# Patient Record
Sex: Female | Born: 1950
Health system: Southern US, Community
[De-identification: ages and names within clinical notes are randomized; demographics above are authoritative.]

## PROBLEM LIST (undated history)

## (undated) DIAGNOSIS — M542 Cervicalgia: Secondary | ICD-10-CM

## (undated) DIAGNOSIS — R51 Headache: Secondary | ICD-10-CM

## (undated) DIAGNOSIS — K635 Polyp of colon: Secondary | ICD-10-CM

## (undated) DIAGNOSIS — K5790 Diverticulosis of intestine, part unspecified, without perforation or abscess without bleeding: Secondary | ICD-10-CM

## (undated) DIAGNOSIS — S161XXA Strain of muscle, fascia and tendon at neck level, initial encounter: Secondary | ICD-10-CM

## (undated) DIAGNOSIS — R011 Cardiac murmur, unspecified: Secondary | ICD-10-CM

## (undated) DIAGNOSIS — Z9989 Dependence on other enabling machines and devices: Principal | ICD-10-CM

## (undated) DIAGNOSIS — T7840XA Allergy, unspecified, initial encounter: Secondary | ICD-10-CM

## (undated) DIAGNOSIS — R519 Headache, unspecified: Secondary | ICD-10-CM

## (undated) DIAGNOSIS — K219 Gastro-esophageal reflux disease without esophagitis: Secondary | ICD-10-CM

## (undated) DIAGNOSIS — I10 Essential (primary) hypertension: Secondary | ICD-10-CM

## (undated) DIAGNOSIS — K649 Unspecified hemorrhoids: Secondary | ICD-10-CM

## (undated) DIAGNOSIS — K222 Esophageal obstruction: Secondary | ICD-10-CM

## (undated) DIAGNOSIS — F32A Depression, unspecified: Secondary | ICD-10-CM

## (undated) DIAGNOSIS — K449 Diaphragmatic hernia without obstruction or gangrene: Secondary | ICD-10-CM

## (undated) DIAGNOSIS — K5792 Diverticulitis of intestine, part unspecified, without perforation or abscess without bleeding: Secondary | ICD-10-CM

## (undated) DIAGNOSIS — F329 Major depressive disorder, single episode, unspecified: Secondary | ICD-10-CM

## (undated) DIAGNOSIS — G4733 Obstructive sleep apnea (adult) (pediatric): Secondary | ICD-10-CM

## (undated) DIAGNOSIS — G4730 Sleep apnea, unspecified: Secondary | ICD-10-CM

## (undated) HISTORY — PX: TMJ ARTHROSCOPY: SHX1067

## (undated) HISTORY — DX: Strain of muscle, fascia and tendon at neck level, initial encounter: S16.1XXA

## (undated) HISTORY — DX: Diaphragmatic hernia without obstruction or gangrene: K44.9

## (undated) HISTORY — DX: Diverticulitis of intestine, part unspecified, without perforation or abscess without bleeding: K57.92

## (undated) HISTORY — DX: Headache: R51

## (undated) HISTORY — DX: Dependence on other enabling machines and devices: Z99.89

## (undated) HISTORY — DX: Major depressive disorder, single episode, unspecified: F32.9

## (undated) HISTORY — PX: OTHER SURGICAL HISTORY: SHX169

## (undated) HISTORY — PX: TONSILLECTOMY: SUR1361

## (undated) HISTORY — DX: Cardiac murmur, unspecified: R01.1

## (undated) HISTORY — PX: COLONOSCOPY: SHX174

## (undated) HISTORY — DX: Sleep apnea, unspecified: G47.30

## (undated) HISTORY — PX: KNEE ARTHROSCOPY W/ MENISCAL REPAIR: SHX1877

## (undated) HISTORY — PX: ABDOMINAL HYSTERECTOMY: SHX81

## (undated) HISTORY — DX: Gastro-esophageal reflux disease without esophagitis: K21.9

## (undated) HISTORY — PX: KNEE SURGERY: SHX244

## (undated) HISTORY — PX: ANKLE SURGERY: SHX546

## (undated) HISTORY — DX: Cervicalgia: M54.2

## (undated) HISTORY — DX: Unspecified hemorrhoids: K64.9

## (undated) HISTORY — DX: Essential (primary) hypertension: I10

## (undated) HISTORY — DX: Headache, unspecified: R51.9

## (undated) HISTORY — DX: Depression, unspecified: F32.A

## (undated) HISTORY — PX: BREAST EXCISIONAL BIOPSY: SUR124

## (undated) HISTORY — DX: Esophageal obstruction: K22.2

## (undated) HISTORY — DX: Obstructive sleep apnea (adult) (pediatric): G47.33

## (undated) HISTORY — DX: Polyp of colon: K63.5

## (undated) HISTORY — DX: Allergy, unspecified, initial encounter: T78.40XA

## (undated) HISTORY — DX: Diverticulosis of intestine, part unspecified, without perforation or abscess without bleeding: K57.90

## (undated) HISTORY — PX: POLYPECTOMY: SHX149

---

## 1994-07-01 ENCOUNTER — Encounter: Payer: Self-pay | Admitting: Internal Medicine

## 1994-08-05 ENCOUNTER — Encounter: Payer: Self-pay | Admitting: Internal Medicine

## 1994-09-23 ENCOUNTER — Encounter: Payer: Self-pay | Admitting: Internal Medicine

## 1996-07-18 ENCOUNTER — Encounter: Payer: Self-pay | Admitting: Internal Medicine

## 1997-05-28 ENCOUNTER — Encounter: Payer: Self-pay | Admitting: Internal Medicine

## 1998-07-29 ENCOUNTER — Other Ambulatory Visit: Admission: RE | Admit: 1998-07-29 | Discharge: 1998-07-29 | Payer: Self-pay | Admitting: Obstetrics and Gynecology

## 1999-07-21 ENCOUNTER — Other Ambulatory Visit: Admission: RE | Admit: 1999-07-21 | Discharge: 1999-07-21 | Payer: Self-pay | Admitting: Obstetrics and Gynecology

## 2000-08-16 ENCOUNTER — Other Ambulatory Visit: Admission: RE | Admit: 2000-08-16 | Discharge: 2000-08-16 | Payer: Self-pay | Admitting: Obstetrics and Gynecology

## 2001-05-02 ENCOUNTER — Other Ambulatory Visit: Admission: RE | Admit: 2001-05-02 | Discharge: 2001-05-02 | Payer: Self-pay | Admitting: Dermatology

## 2001-07-25 ENCOUNTER — Ambulatory Visit (HOSPITAL_BASED_OUTPATIENT_CLINIC_OR_DEPARTMENT_OTHER): Admission: RE | Admit: 2001-07-25 | Discharge: 2001-07-25 | Payer: Self-pay | Admitting: Orthopedic Surgery

## 2001-07-28 ENCOUNTER — Encounter (HOSPITAL_COMMUNITY): Admission: RE | Admit: 2001-07-28 | Discharge: 2001-08-27 | Payer: Self-pay | Admitting: Orthopedic Surgery

## 2001-08-22 ENCOUNTER — Other Ambulatory Visit: Admission: RE | Admit: 2001-08-22 | Discharge: 2001-08-22 | Payer: Self-pay | Admitting: Obstetrics and Gynecology

## 2001-08-30 ENCOUNTER — Encounter (HOSPITAL_COMMUNITY): Admission: RE | Admit: 2001-08-30 | Discharge: 2001-09-29 | Payer: Self-pay | Admitting: Orthopedic Surgery

## 2001-09-01 ENCOUNTER — Emergency Department (HOSPITAL_COMMUNITY): Admission: EM | Admit: 2001-09-01 | Discharge: 2001-09-01 | Payer: Self-pay | Admitting: *Deleted

## 2002-09-01 ENCOUNTER — Encounter: Payer: Self-pay | Admitting: Internal Medicine

## 2002-10-03 ENCOUNTER — Encounter: Payer: Self-pay | Admitting: Internal Medicine

## 2003-07-04 ENCOUNTER — Encounter: Admission: RE | Admit: 2003-07-04 | Discharge: 2003-07-04 | Payer: Self-pay | Admitting: Obstetrics and Gynecology

## 2003-08-31 ENCOUNTER — Emergency Department (HOSPITAL_COMMUNITY): Admission: EM | Admit: 2003-08-31 | Discharge: 2003-08-31 | Payer: Self-pay | Admitting: Emergency Medicine

## 2003-11-29 ENCOUNTER — Other Ambulatory Visit: Admission: RE | Admit: 2003-11-29 | Discharge: 2003-11-29 | Payer: Self-pay | Admitting: Dermatology

## 2004-06-03 ENCOUNTER — Encounter: Payer: Self-pay | Admitting: Internal Medicine

## 2004-07-04 ENCOUNTER — Encounter: Admission: RE | Admit: 2004-07-04 | Discharge: 2004-07-04 | Payer: Self-pay | Admitting: Obstetrics and Gynecology

## 2005-07-06 ENCOUNTER — Encounter: Admission: RE | Admit: 2005-07-06 | Discharge: 2005-07-06 | Payer: Self-pay | Admitting: Obstetrics and Gynecology

## 2005-09-14 ENCOUNTER — Ambulatory Visit (HOSPITAL_BASED_OUTPATIENT_CLINIC_OR_DEPARTMENT_OTHER): Admission: RE | Admit: 2005-09-14 | Discharge: 2005-09-14 | Payer: Self-pay | Admitting: Urology

## 2006-07-09 ENCOUNTER — Encounter: Admission: RE | Admit: 2006-07-09 | Discharge: 2006-07-09 | Payer: Self-pay | Admitting: Obstetrics and Gynecology

## 2007-01-21 ENCOUNTER — Ambulatory Visit (HOSPITAL_COMMUNITY): Admission: RE | Admit: 2007-01-21 | Discharge: 2007-01-21 | Payer: Self-pay | Admitting: Obstetrics and Gynecology

## 2007-03-06 ENCOUNTER — Emergency Department (HOSPITAL_COMMUNITY): Admission: EM | Admit: 2007-03-06 | Discharge: 2007-03-06 | Payer: Self-pay | Admitting: Emergency Medicine

## 2007-03-07 ENCOUNTER — Ambulatory Visit (HOSPITAL_COMMUNITY): Admission: RE | Admit: 2007-03-07 | Discharge: 2007-03-07 | Payer: Self-pay | Admitting: Emergency Medicine

## 2007-03-17 ENCOUNTER — Ambulatory Visit: Payer: Self-pay | Admitting: Internal Medicine

## 2007-04-01 ENCOUNTER — Ambulatory Visit: Payer: Self-pay | Admitting: Internal Medicine

## 2007-04-01 ENCOUNTER — Encounter: Payer: Self-pay | Admitting: Internal Medicine

## 2007-04-01 DIAGNOSIS — D126 Benign neoplasm of colon, unspecified: Secondary | ICD-10-CM

## 2007-04-30 DIAGNOSIS — K5730 Diverticulosis of large intestine without perforation or abscess without bleeding: Secondary | ICD-10-CM | POA: Insufficient documentation

## 2007-04-30 DIAGNOSIS — R1031 Right lower quadrant pain: Secondary | ICD-10-CM | POA: Insufficient documentation

## 2007-04-30 DIAGNOSIS — M129 Arthropathy, unspecified: Secondary | ICD-10-CM | POA: Insufficient documentation

## 2007-04-30 DIAGNOSIS — N201 Calculus of ureter: Secondary | ICD-10-CM

## 2007-04-30 DIAGNOSIS — K644 Residual hemorrhoidal skin tags: Secondary | ICD-10-CM | POA: Insufficient documentation

## 2007-04-30 DIAGNOSIS — K648 Other hemorrhoids: Secondary | ICD-10-CM | POA: Insufficient documentation

## 2007-04-30 DIAGNOSIS — R1013 Epigastric pain: Secondary | ICD-10-CM | POA: Insufficient documentation

## 2007-04-30 DIAGNOSIS — K7689 Other specified diseases of liver: Secondary | ICD-10-CM | POA: Insufficient documentation

## 2007-04-30 DIAGNOSIS — I10 Essential (primary) hypertension: Secondary | ICD-10-CM | POA: Insufficient documentation

## 2007-04-30 DIAGNOSIS — K625 Hemorrhage of anus and rectum: Secondary | ICD-10-CM

## 2007-07-11 ENCOUNTER — Encounter: Admission: RE | Admit: 2007-07-11 | Discharge: 2007-07-11 | Payer: Self-pay | Admitting: Obstetrics and Gynecology

## 2007-10-03 ENCOUNTER — Telehealth: Payer: Self-pay | Admitting: Internal Medicine

## 2007-10-07 ENCOUNTER — Ambulatory Visit: Payer: Self-pay | Admitting: Internal Medicine

## 2007-10-07 DIAGNOSIS — K589 Irritable bowel syndrome without diarrhea: Secondary | ICD-10-CM

## 2007-10-07 DIAGNOSIS — K219 Gastro-esophageal reflux disease without esophagitis: Secondary | ICD-10-CM

## 2007-10-07 LAB — CONVERTED CEMR LAB
Hemoglobin, Urine: NEGATIVE
Ketones, ur: NEGATIVE mg/dL
Leukocytes, UA: NEGATIVE
Specific Gravity, Urine: 1.02 (ref 1.000–1.03)
pH: 6 (ref 5.0–8.0)

## 2007-10-12 ENCOUNTER — Ambulatory Visit (HOSPITAL_COMMUNITY): Admission: RE | Admit: 2007-10-12 | Discharge: 2007-10-12 | Payer: Self-pay | Admitting: Internal Medicine

## 2007-10-18 ENCOUNTER — Telehealth: Payer: Self-pay | Admitting: Internal Medicine

## 2007-10-20 ENCOUNTER — Ambulatory Visit: Payer: Self-pay | Admitting: Internal Medicine

## 2007-11-23 ENCOUNTER — Ambulatory Visit: Payer: Self-pay | Admitting: Internal Medicine

## 2008-07-13 ENCOUNTER — Encounter: Admission: RE | Admit: 2008-07-13 | Discharge: 2008-07-13 | Payer: Self-pay | Admitting: Obstetrics and Gynecology

## 2009-07-15 ENCOUNTER — Ambulatory Visit (HOSPITAL_COMMUNITY)
Admission: RE | Admit: 2009-07-15 | Discharge: 2009-07-15 | Payer: Self-pay | Source: Home / Self Care | Admitting: Obstetrics and Gynecology

## 2010-04-29 ENCOUNTER — Ambulatory Visit (HOSPITAL_COMMUNITY)
Admission: RE | Admit: 2010-04-29 | Discharge: 2010-04-29 | Payer: Self-pay | Source: Home / Self Care | Attending: Family Medicine | Admitting: Family Medicine

## 2010-04-29 ENCOUNTER — Encounter: Payer: Self-pay | Admitting: Internal Medicine

## 2010-04-29 ENCOUNTER — Encounter (INDEPENDENT_AMBULATORY_CARE_PROVIDER_SITE_OTHER): Payer: Self-pay | Admitting: *Deleted

## 2010-05-04 ENCOUNTER — Encounter: Payer: Self-pay | Admitting: Obstetrics and Gynecology

## 2010-05-15 NOTE — Letter (Signed)
Summary: [facility]   Imported By: Lester Naponee 05/06/2010 11:18:12  _____________________________________________________________________  External Attachment:    Type:   Image     Comment:   External Document

## 2010-06-09 ENCOUNTER — Ambulatory Visit (INDEPENDENT_AMBULATORY_CARE_PROVIDER_SITE_OTHER): Payer: PRIVATE HEALTH INSURANCE | Admitting: Internal Medicine

## 2010-06-09 ENCOUNTER — Encounter: Payer: Self-pay | Admitting: Internal Medicine

## 2010-06-09 DIAGNOSIS — K5732 Diverticulitis of large intestine without perforation or abscess without bleeding: Secondary | ICD-10-CM

## 2010-06-09 DIAGNOSIS — K219 Gastro-esophageal reflux disease without esophagitis: Secondary | ICD-10-CM

## 2010-06-10 NOTE — Procedures (Signed)
Summary: Colonoscopy   Colonoscopy  Procedure date:  10/03/2002  Findings:      Location:  [facility].  Results: Diverticulosis.         Procedures Next Due Date:    Colonoscopy: 10/2007 Patient Name: Shelley Petersen, Shelley Petersen MRN:  Procedure Procedures: Colonoscopy CPT: [phone].  Personnel: Endoscopist: Wilhemina Bonito. Marina Goodell, MD.  Referred By: Malachi Pro Ambrose Mantle, MD.  Exam Location: Exam performed in Outpatient Clinic. Outpatient  Patient Consent: Procedure, Alternatives, Risks and Benefits discussed, consent obtained, from patient. Consent was obtained by the RN.  Indications  Average Risk Screening Routine.  History  Pre-Exam Physical: Performed Oct 03, 2002. Entire physical exam was normal.  Exam Exam: Extent of exam reached: Cecum, extent intended: Cecum.  The cecum was identified by appendiceal orifice and IC valve. Patient position: on left side. Colon retroflexion performed. Images taken. ASA Classification: II. Tolerance: excellent.  Monitoring: Pulse and BP monitoring, Oximetry used. Supplemental O2 given.  Colon Prep Used Golytely for colon prep. Prep results: good.  Sedation Meds: Patient assessed and found to be appropriate for moderate (conscious) sedation. Fentanyl 75 mcg. given IV. Versed 7 mg. given IV.  Findings NORMAL EXAM: Cecum to Rectum.  - DIVERTICULOSIS: Ascending Colon to Sigmoid Colon. ICD9: Diverticulosis, Colon: 562.10.   Assessment Abnormal examination, see findings above.  Diagnoses: 562.10: Diverticulosis, Colon.   Events  Unplanned Interventions: No intervention was required.  Unplanned Events: There were no complications. Plans Disposition: After procedure patient sent to recovery. After recovery patient sent home.  Scheduling/Referral: Colonoscopy, to Wilhemina Bonito. Marina Goodell, MD, in about 5 years for repeat screening,    This report was created from the original endoscopy report, which was reviewed and signed by the above listed  endoscopist.   cc:  Tracey Harries, MD      [facility]      The Patient

## 2010-06-19 NOTE — Consult Note (Signed)
[facility]   Imported By: Sherian Rein 06/09/2010 14:25:04  _____________________________________________________________________  External Attachment:    Type:   Image     Comment:   External Document

## 2010-06-19 NOTE — Consult Note (Signed)
[facility]   Imported By: Sherian Rein 06/09/2010 14:26:09  _____________________________________________________________________  External Attachment:    Type:   Image     Comment:   External Document

## 2010-06-19 NOTE — Progress Notes (Signed)
[facility]   Imported By: Sherian Rein 06/09/2010 14:23:24  _____________________________________________________________________  External Attachment:    Type:   Image     Comment:   External Document

## 2010-06-19 NOTE — Assessment & Plan Note (Signed)
Summary: Abdominal pain -  diverticulitis    History of Present Illness Visit Type: Follow-up Visit Primary GI Petersen: Shelley Petersen Primary Provider: Audrea Muscat, Petersen Chief Complaint: Diverticular disease flare Jan. 17, 2012 History of Present Illness:    60 year old female with a history of gastroesophageal reflux disease, incidental esophageal stricture, irritable bowel syndrome, diverticulosis, hypertension, and hemorrhoids. She was last seen August 2009. She was continued on PPI therapy for GERD. She presents today after having had a recent bout of diverticulitis. In mid January , she presented to her PCP with left lower quadrant pain and low-grade fever. She was treated with a 10 day course of ciprofloxacin and metronidazole. Pain resolved within one week. No problems since. She is accompanied today by her husband. She occasionally notices some bright red blood on the tissue with wiping only. Her last complete colonoscopy was in 2008. Severe diverticulosis, hemorrhoids, and hyperplastic polyp removed. Followup in 10 years recommended. Also previous colonoscopy in 2004. Terms of GERD, she reports good control of symptoms with Prilosec. No dysphasia. Otherwise, been doing well.   GI Review of Systems    Reports acid reflux.      Denies abdominal pain, belching, bloating, chest pain, dysphagia with liquids, dysphagia with solids, heartburn, loss of appetite, nausea, vomiting, vomiting blood, weight loss, and  weight gain.      Reports diverticulosis.     Denies anal fissure, black tarry stools, change in bowel habit, constipation, diarrhea, fecal incontinence, heme positive stool, hemorrhoids, irritable bowel syndrome, jaundice, light color stool, liver problems, rectal bleeding, and  rectal pain.    Current Medications (verified): 1)  Aspirin Ec 81 Mg  Tbec (Aspirin) .Marland Kitchen.. 1 Tablet Once Daily 2)  Prilosec 20 Mg  Cpdr (Omeprazole) .Marland Kitchen.. 1 Each Day 30 Minutes Before Meal 3)  Aleve 220 Mg Tabs  (Naproxen Sodium) .... Take 2 Tablets Daily  Allergies: 1)  ! * Tylenol #3 2)  ! Percocet 3)  ! * Tramadol  Past History:  Past Medical History: Reviewed history from 04/30/2007 and no changes required. Current Problems:  COLONIC POLYPS, HYPERPLASTIC (ICD-211.3) ARTHRITIS (ICD-716.90) HYPERTENSION (ICD-401.9) URETEROLITHIASIS (ICD-592.1) HEPATIC CYST (ICD-573.8) DIVERTICULAR DISEASE (ICD-562.10) EPIGASTRIC DISCOMFORT (ICD-789.06) HEMORRHOIDS, EXTERNAL (ICD-455.3) INTERNAL HEMORRHOIDS (ICD-455.0) ABDOMINAL PAIN, RIGHT LOWER QUADRANT (ICD-789.03) RECTAL BLEEDING (ICD-569.3)  Past Surgical History: Reviewed history from 04/30/2007 and no changes required. Breast surgery 1980s Total Abdominal Hysterectomy  Family History: Reviewed history from 11/23/2007 and no changes required. Diabetes and heart disease in her father. Breast cancer in her mother. No gastrointestinal disorders or gastrointestinal malignancy. Family History of Colon Cancer:Father  Social History: Reviewed history from 11/23/2007 and no changes required. Married. Employed as a Advertising account planner. Does not smoke or use alcohol. Daily Caffeine Use-1 Patient gets regular exercise.  Review of Systems       The patient complains of arthritis/joint pain.  The patient denies allergy/sinus, anemia, anxiety-new, back pain, blood in urine, breast changes/lumps, change in vision, confusion, cough, coughing up blood, depression-new, fainting, fatigue, fever, headaches-new, hearing problems, heart murmur, heart rhythm changes, itching, menstrual pain, muscle pains/cramps, night sweats, nosebleeds, pregnancy symptoms, shortness of breath, skin rash, sleeping problems, sore throat, swelling of feet/legs, swollen lymph glands, thirst - excessive , urination - excessive , urination changes/pain, urine leakage, vision changes, and voice change.    Vital Signs:  Patient profile:   60 year old female Height:      59.5  inches Weight:      130.25 pounds BMI:  25.96 Pulse rate:   72 / minute Pulse rhythm:   regular BP sitting:   116 / 80  (left arm) Cuff size:   regular  Vitals Entered By: June McMurray CMA Duncan Dull) (June 09, 2010 8:26 AM)  Physical Exam  General:  Well developed, well nourished, no acute distress. Head:  Normocephalic and atraumatic. Eyes:  PERRLA, no icterus. Nose:  No deformity, discharge,  or lesions. Mouth:  No deformity or lesions, dentition normal. Neck:  Supple; no masses or thyromegaly. Lungs:  Clear throughout to auscultation. Heart:  Regular rate and rhythm; no murmurs, rubs,  or bruits. Abdomen:  Soft, nontender and nondistended. No masses, hepatosplenomegaly or hernias noted. Normal bowel sounds. Msk:  Symmetrical with no gross deformities. Normal posture. Pulses:  Normal pulses noted. Extremities:  No clubbing, cyanosis, edema or deformities noted. Neurologic:  Alert and  oriented x4 Skin:  Intact without significant lesions or rashes. Psych:  Alert and cooperative. Normal mood and affect.   Impression & Recommendations:  Problem # 1:  DIVERTICULITIS-COLON (ICD-562.11)  recent bout of acute left lower quadrant abdominal pain and low-grade fever consistent with diverticulitis. good response to appropriate antimicrobial therapy. Currently asymptomatic.   Plan  #1. Discussion on diverticular disease  #2. literature on diverticular disease  #3. followup when necessary  Problem # 2:  COLONIC POLYPS, HYPERPLASTIC (ICD-211.3)  colonoscopy up to date. due for routine surveillance in 2018  Problem # 3:  GERD (ICD-530.81)  good symptom control on Prilosec.  Plan  #1. Reflux precautions #2. Continue Prilosec to control GERD symptoms  #3. routine followup in 2 years. Sooner for interval problems such as breakthrough symptoms on medication for the development of dysphagia  Problem # 4:  IRRITABLE BOWEL SYNDROME (ICD-564.1) Assessment: Comment Only  Problem #  5:  HEMORRHOIDS, EXTERNAL (ICD-455.3) Assessment: Comment Only  Problem # 6:  INTERNAL HEMORRHOIDS (ICD-455.0) Assessment: Comment Only  Patient Instructions: 1)  Please schedule a follow-up appointment as needed.  2)  The medication list was reviewed and reconciled.  All changed / newly prescribed medications were explained.  A complete medication list was provided to the patient / caregiver. 3)  Copy sent to : Audrea Muscat, Petersen

## 2010-06-19 NOTE — Progress Notes (Signed)
[facility]   Imported By: Sherian Rein 06/09/2010 14:17:56  _____________________________________________________________________  External Attachment:    Type:   Image     Comment:   External Document

## 2010-06-19 NOTE — Op Note (Signed)
[facility]   Imported By: Sherian Rein 06/09/2010 14:19:14  _____________________________________________________________________  External Attachment:    Type:   Image     Comment:   External Document

## 2010-06-19 NOTE — Progress Notes (Signed)
[facility]   Imported By: Sherian Rein 06/09/2010 14:21:29  _____________________________________________________________________  External Attachment:    Type:   Image     Comment:   External Document

## 2010-06-19 NOTE — Consult Note (Signed)
[facility]   Imported By: Sherian Rein 06/09/2010 14:16:38  _____________________________________________________________________  External Attachment:    Type:   Image     Comment:   External Document

## 2010-08-26 NOTE — Assessment & Plan Note (Signed)
[facility]                         GASTROENTEROLOGY OFFICE NOTE   NOELE, ICENHOUR                         MRN:          [id number]  DATE:03/17/2007                            DOB:          Jun 16, 1950    REFERRING PHYSICIAN:  Malachi Pro. Ambrose Mantle, M.D.   OFFICE CONSULT:   PROBLEMS:  1. Right lower quadrant pain.  2. Intermittent rectal bleeding.  3. Painful hemorrhoids.  4. Epigastric discomfort.   HISTORY:  Shelley Petersen is a pleasant 60 year old white female known previously  to Dr. Marina Goodell, who had undergone colonoscopy in June, 2004, which was  done for routine screening.  She did have diverticular disease from the  ascending colon to the sigmoid colon.  She has not been seen here since  2004.  At this time, she says she has been having three separate  problems.  She had an ER visit in November due to complaints of acute  epigastric pain.  She had undergone abdominal ultrasound, which was  negative with the exception of multiple small hepatic cysts.  She is  concerned about this epigastric discomfort, which has persistent  intermittently but has not been severe because she has a strong family  history of gallbladder disease in both her mother and her sisters.  Interestingly, the patient had started on Aleve 1 twice daily about a  week before that pain started because she had been having problems with  her knee.  She continues on the Aleve b.i.d. at this point.  She has not  been having any nausea or vomiting.  Says that her pain is usually worse  post prandially but not always.  She has not had any fevers or chills.   The second problem is intermittent rectal bleeding which she has noticed  since June, 2008.  She says generally this is just a small amount of  bright red blood on the tissue only, and she had attributed this to  hemorrhoids.  Now, over the past couple of weeks, she has had a  hemorrhoid which has been protruding and has been quite  uncomfortable.  She has been using Preparation H without any benefit.   The third problem is that of a sharp right lower quadrant pain which has  been bothering her intermittently over the past six months.  She says at  times this is quite intense and feels like labor pains.  She has been  seen and evaluated by Dr. Ambrose Mantle, who ruled out any underlying GYN  pathology.  She is status post hysterectomy in the 1980s.  She did have  CT scan of the abdomen and pelvis done in October, 2008.  We do have  that report, and this was read as a negative exam.   CURRENT MEDICATIONS:  1. Omega 3 once daily.  2. Calcium supplement daily.  3. Aspirin 81 mg daily.  4. Sinus supplement daily.  5. Aleve 1 b.i.d.   INTOLERANCES/ALLERGIES:  TYLENOL #3, with nausea.   PAST MEDICAL HISTORY:  1. Ureterolithiasis.  2. Hypertension.  3. Arthritis.  4. She had a torn meniscus in her left  knee in 2008.  5. Had remote breast surgery in the 1980s and a hysterectomy in the      1980s, as above.   FAMILY HISTORY:  Positive for diabetes and heart disease in her father,  breast cancer in her mother.  Negative for GI disease.   SOCIAL HISTORY:  Patient is married.  She is employed as a Radio broadcast assistant.  She is a nonsmoker, nondrinker.   REVIEW OF SYSTEMS:  Positive for chronic low back pain, chronic  insomnia.  She apparently has a cyst on her bladder and will  intermittently see a small amount of hematuria and also has some  incontinence periodically.   PHYSICAL EXAMINATION:  A well-developed white female in no acute  distress.  Height is 4 foot 11-1/2.  Weight is 134.  Blood pressure 118/78.  Pulse  is 78.  HEENT:  Normocephalic and atraumatic.  EOMI.  PERRLA.  Sclerae are  anicteric.  NECK:  Supple.  CARDIOVASCULAR:  Regular rate and rhythm with an S1 and S2.  No murmur,  rub or gallop.  PULMONARY:  Clear to A&P.  ABDOMEN:  Soft.  Bowel sounds are active.  She is tender in the  epigastrium and right upper  quadrant, also in the right lower quadrant.  There is no guarding or rebound.  No mass or hepatosplenomegaly.  RECTAL:  A large, prolapsed internal hemorrhoid which is inflamed but  not thrombosed.  Quite tender to exam.  Stool is brown and hemoccult  negative currently.   IMPRESSION:  52. A 60 year old white female with persistent epigastric discomfort x3      weeks, recent negative ultrasound.  Rule out nonsteroidal anti-      inflammatory induced gastritis or peptic ulcer disease.  Rule out      biliary dyskinesia.  2. Intermittent hematochezia, etiology not clear.  3. Internal hemorrhoid prolapse.  4. Right lower quadrant pain x6 months with recent negative CT.   PLAN:  1. DC Aleve.  2. Start Protonix 40 mg p.o. b.i.d. x1 week, then daily for six weeks.      If her symptoms persist, she may need endoscopy or CCK HIDA scan.  3. Start Analpram 2.5% cream t.i.d. x10 days, then p.r.n. thereafter.      Also, Anusol-HC suppositories nightly x10 days, then p.r.n.  I      discussed sitz baths and Tucks pads with her for comfort.  4. Schedule colonoscopy for further evaluation of the hematochezia and      persistent right lower quadrant pain.      Mike Gip, PA-C  Electronically Signed      Shelley Boop, MD,FACG  Electronically Signed   AE/MedQ  DD: 03/17/2007  DT: 03/18/2007  Job #: [id number]   cc:   Wilhemina Bonito. Marina Goodell, MD

## 2010-08-29 NOTE — Op Note (Signed)
[place]  Patient:    Shelley Petersen, Shelley Petersen Visit Number: [id number] MRN: [id number]          Service Type: [facility] Location: [facility] Attending Physician:  Twana First Dictated by:   Elana Alm Thurston Hole, M.D. Proc. Date: 07/25/01 Admit Date:  07/28/2001                             Operative Report  PREOPERATIVE DIAGNOSIS:  Right shoulder rotator cuff tendonitis with impingement and acromioclavicular joint arthrosis.  POSTOPERATIVE DIAGNOSES: 1. Right shoulder partial rotator cuff tear with impingement. 2. Right shoulder acromioclavicular joint arthrosis.  PROCEDURE: 1. Right shoulder EUA followed by arthroscopic partial rotator cuff tear    debridement. 2. Right shoulder subacromial decompression. 3. Right shoulder distal clavicle excision.  SURGEON:  Elana Alm. Thurston Hole, M.D.  ASSISTANT:  Julien Girt, P.A.  ANESTHESIA:  General.  OPERATIVE TIME:  45 minutes.  COMPLICATIONS:  None.  INDICATIONS FOR PROCEDURE:  Shelley Petersen is a 60 year old woman who has had over a year of right shoulder pain, increasing in nature, with signs and symptoms and MRI documenting a partial rotator cuff tear and impingement with AC joint arthrosis who has failed conservative care and is now to undergo arthroscopy.  DESCRIPTION:  Shelley Petersen was brought to the operating room on July 25, 2001, placed on the operative table in supine position.  After an adequate level of general anesthesia was obtained, her right shoulder was examined under anesthesia.  She had full range of motion and her shoulder was stable to ligamentous exam.  She was then placed in a beach chair position and her shoulder and arm were prepped using sterile Betadine and draped using sterile technique.  Originally through a posterior arthroscopic portal the arthroscope with a pump attached was placed, and through an anterior portal an arthroscopic probe was placed.  On initial inspection the  articular cartilage in the glenohumeral joint was intact, anterior and posterior labrum intact, superior labrum intact, biceps tendon anchor intact, biceps tendon intact, inferior labrum and anterior inferior glenohumeral ligament complex was intact.  The rotator cuff was thoroughly inspected but there was found to be no evidence of a tear.  Subacromial space was entered and a lateral arthroscopic portal was made.  Moderately thickened bursitis was resected. Underneath this, the rotator cuff had partial tearing on the bursal surface which was debrided but no complete tear was found.  Significant spurring noted in the acromion and in the [facility] joint area.  A subacromial decompression was carried out, removing 6-8 mm of the undersurface of the anterior, anterolateral, and anteromedial acromion; and CA ligament release carried out as well.  The distal clavicle was exposed, and using a 6 mm burr the distal 5-6 mm was resected under arthroscopic visualization for significant arthrosis and impingement in this area.  There was no definite cystic structure noted above this area, but this complete area was completely resected satisfactorily such that no impingement was found through a full range of motion of the shoulder.  After this was done it was felt that all pathology had been satisfactorily addressed and the shoulder could be brought through a full range of motion with no impingement.  The arthroscopic instruments were removed.  Portals were closed with 3-0 nylon suture and injected with 0.25% Marcaine with epinephrine, sterile dressings and a sling applied, the patient awakened and take to the recovery room in stable condition.  FOLLOW-UP  CARE:  Shelley Petersen will be followed as an outpatient on Vicodin and Naprosyn.  See her back in the office in a week for sutures out and follow-up. Dictated by:   Elana Alm Thurston Hole, M.D. Attending Physician:  Twana First DD:  07/25/01 TD:  07/25/01 Job:  [id number]

## 2010-08-29 NOTE — Op Note (Signed)
NAME:  MARCELLA, Shelley Petersen                  ACCOUNT NO.:  [id number]   MEDICAL RECORD NO.:  [id number]          PATIENT TYPE:  AMB   LOCATION:  [facility]                         FACILITY:  [facility]   PHYSICIAN:  Bertram Millard. Dahlstedt, M.D.DATE OF BIRTH:  [birth year]   DATE OF PROCEDURE:  09/14/2005  DATE OF DISCHARGE:                                 OPERATIVE REPORT   PREOPERATIVE DIAGNOSIS:  Possible interstitial cystitis.   POSTOPERATIVE DIAGNOSIS:  Probable interstitial cystitis.   SURGEON:  Bertram Millard. Dahlstedt, M.D.   ANESTHESIA:  General LMA.   SURGICAL PROCEDURES:  1.  Cystoscopy.  2.  Urethral dilation.  3.  Hydrodistention of the bladder.  4.  Instillation of Pyridium and Marcaine.   BRIEF HISTORY:  This 60 year old female comes to the outpatient surgery  today for a cystoscopy, hydrodistention of her bladder.  She has symptoms  reminiscent of interstitial cystitis.  She has frequency, urgency and some  pelvic pain.  With her frequency and urgency recent urodynamics revealed a  normal capacity bladder, but with significant urgency with low volumes of  water infused.  Her maximum capacity was 400 cc, but at that time she had  severe plain pain..  She had a fairly noncompliant and hypersensitive  bladder.   She was seen recently and we talked about treatment options.  She has  decided to go with cystoscopy and HOB.  Risks and complications have been  discussed with the patient, who understands these and desires to proceed.   DESCRIPTION OF PROCEDURE:  Franceen was identified in the holding area,  administered preoperative IV antibiotics, taken to the operating room where  general anesthetic was administered using LMA.  She was placed in the dorsal  lithotomy position.  Genitalia and perineum were prepped and draped.  An  attempt was made to place a cystoscope in the bladder using the obturator.  However, she had a stenotic urethra.  She was dilated to 24-French with  female sounds.  At  this point, the scope passed easily.  The urethra itself  appeared normal.  Bladder was inspected circumferentially.  There were no  foreign bodies, no ulcers, no significant erythematous areas.  The ureteral  orifices were normal configuration and location.  Her bladder was filled  with water at 800 mm above the bladder.  The bladder was hydrodistended for  approximately 5 minutes.  The bladder capacity was 650 cc under anesthetic.  Inspection of the bladder following this revealed multiple glomerulations.  No other lesions were seen.  At this point, the bladder was decompressed.  A  20 cc solution of Marcaine and Pyridium was then placed.   Additionally, a B & O suppository was placed.  The patient was will be  discharged on hydrocodone/APAP,  which she has at home.  She was also given  a 3-day course of Bactrim DS one p.o. b.i.d., and [place] one p.o. q.6 h p.r.n.  bladder frequency and urgency.  She will follow up in 1-2 weeks.      Bertram Millard. Dahlstedt, M.D.  Electronically Signed     SMD/MEDQ  D:  09/14/2005  T:  09/14/2005  Job:  [id number]

## 2010-12-25 ENCOUNTER — Other Ambulatory Visit (HOSPITAL_COMMUNITY): Payer: Self-pay | Admitting: Family Medicine

## 2010-12-25 DIAGNOSIS — R109 Unspecified abdominal pain: Secondary | ICD-10-CM

## 2010-12-30 ENCOUNTER — Ambulatory Visit (HOSPITAL_COMMUNITY)
Admission: RE | Admit: 2010-12-30 | Discharge: 2010-12-30 | Disposition: A | Discharge status: Home / Self Care | Payer: PRIVATE HEALTH INSURANCE | Source: Ambulatory Visit | Attending: Family Medicine | Admitting: Family Medicine

## 2010-12-30 ENCOUNTER — Other Ambulatory Visit (HOSPITAL_COMMUNITY): Payer: Self-pay | Admitting: Family Medicine

## 2010-12-30 DIAGNOSIS — R109 Unspecified abdominal pain: Secondary | ICD-10-CM

## 2010-12-30 DIAGNOSIS — N949 Unspecified condition associated with female genital organs and menstrual cycle: Secondary | ICD-10-CM | POA: Insufficient documentation

## 2011-01-20 LAB — COMPREHENSIVE METABOLIC PANEL
Albumin: 4.1
Alkaline Phosphatase: 94
GFR calc non Af Amer: >60
Glucose, Bld: 121 — ABNORMAL HIGH
Potassium: 3.4 — ABNORMAL LOW
Sodium: 144
Total Bilirubin: 0.6
Total Protein: 7.1

## 2011-01-20 LAB — LIPASE, BLOOD: Lipase: 44

## 2011-01-20 LAB — CBC
MCHC: 33.6
RDW: 13

## 2011-01-20 LAB — DIFFERENTIAL
Basophils Absolute: 0
Basophils Relative: 1
Lymphs Abs: 2.1
Monocytes Absolute: 0.6

## 2011-03-27 ENCOUNTER — Telehealth: Payer: Self-pay | Admitting: Internal Medicine

## 2011-03-27 NOTE — Telephone Encounter (Signed)
Unfortunately I have received the message after  5.00 pm, please obtain CBC, stay on clear liquids,  Follow up on Monday 03/30/2011, The patient has been notified of this information and all questions answered. Dr Marina Goodell at that time.

## 2011-03-27 NOTE — Telephone Encounter (Signed)
Pt states that she thinks she is having a diverticulitis flare. States that for 3 days she has been having pain in her abdomen on the left side. Pt states that it hurts when she laughs and when she walks. Pt had diverticulitis last in January. Dr. Juanda Chance as doc of the day please advise.

## 2011-03-30 NOTE — Telephone Encounter (Signed)
Unable to reach pt at the phone number listed. States the phone number has been disconnected or is no longer in service.

## 2011-03-31 NOTE — Telephone Encounter (Signed)
Unable to reach pt at the listed phone number. Will be happy to speak with the pt if they call back.

## 2012-01-11 ENCOUNTER — Other Ambulatory Visit (HOSPITAL_COMMUNITY): Payer: Self-pay | Admitting: Family Medicine

## 2012-01-11 DIAGNOSIS — Z1231 Encounter for screening mammogram for malignant neoplasm of breast: Secondary | ICD-10-CM

## 2012-01-27 ENCOUNTER — Ambulatory Visit (HOSPITAL_COMMUNITY): Payer: Self-pay

## 2012-01-28 ENCOUNTER — Ambulatory Visit (HOSPITAL_COMMUNITY)
Admission: RE | Admit: 2012-01-28 | Discharge: 2012-01-28 | Disposition: A | Discharge status: Home / Self Care | Payer: Self-pay | Source: Ambulatory Visit | Attending: Family Medicine | Admitting: Family Medicine

## 2012-01-28 DIAGNOSIS — Z1231 Encounter for screening mammogram for malignant neoplasm of breast: Secondary | ICD-10-CM

## 2012-08-09 ENCOUNTER — Other Ambulatory Visit (HOSPITAL_COMMUNITY): Payer: Self-pay | Admitting: Cardiovascular Disease

## 2012-08-09 DIAGNOSIS — R079 Chest pain, unspecified: Secondary | ICD-10-CM

## 2012-08-19 ENCOUNTER — Ambulatory Visit (HOSPITAL_COMMUNITY)
Admission: RE | Admit: 2012-08-19 | Discharge: 2012-08-19 | Disposition: A | Discharge status: Home / Self Care | Payer: 59 | Source: Ambulatory Visit | Attending: Cardiovascular Disease | Admitting: Cardiovascular Disease

## 2012-08-19 DIAGNOSIS — Z8249 Family history of ischemic heart disease and other diseases of the circulatory system: Secondary | ICD-10-CM | POA: Insufficient documentation

## 2012-08-19 DIAGNOSIS — R5383 Other fatigue: Secondary | ICD-10-CM | POA: Insufficient documentation

## 2012-08-19 DIAGNOSIS — R079 Chest pain, unspecified: Secondary | ICD-10-CM | POA: Insufficient documentation

## 2012-08-19 DIAGNOSIS — R42 Dizziness and giddiness: Secondary | ICD-10-CM | POA: Insufficient documentation

## 2012-08-19 DIAGNOSIS — R5381 Other malaise: Secondary | ICD-10-CM | POA: Insufficient documentation

## 2012-08-19 DIAGNOSIS — R0609 Other forms of dyspnea: Secondary | ICD-10-CM | POA: Insufficient documentation

## 2012-08-19 DIAGNOSIS — R0989 Other specified symptoms and signs involving the circulatory and respiratory systems: Secondary | ICD-10-CM | POA: Insufficient documentation

## 2012-08-19 MED ORDER — TECHNETIUM TC 99M SESTAMIBI GENERIC - CARDIOLITE
30.0000 | Freq: Once | INTRAVENOUS | Status: AC | PRN
  Administered 2012-08-19: 30 via INTRAVENOUS

## 2012-08-19 MED ORDER — TECHNETIUM TC 99M SESTAMIBI GENERIC - CARDIOLITE
10.0000 | Freq: Once | INTRAVENOUS | Status: AC | PRN
  Administered 2012-08-19: 10 via INTRAVENOUS

## 2012-08-19 NOTE — Procedures (Addendum)
[facility] [address] [place] [address] [place] [address] [phone]  Cardiology Nuclear Med Study  Shelley Petersen is a 62 y.o. female     MRN : [id number]     DOB: [birth year]  Procedure Date: 08/19/2012  Nuclear Med Background Indication for Stress Test:  Evaluation for Ischemia History:  NO PRIOR HISTORY REPORTED Cardiac Risk Factors: Family History - CAD, Hypertension and Obesity  Symptoms:  Chest Pain, Dizziness, DOE and Fatigue   Nuclear Pre-Procedure Caffeine/Decaff Intake:  7:00pm NPO After: 5:00am   IV Site: R Forearm  IV 0.9% NS with Angio Cath:  22g  Chest Size (in):  N/A IV Started by: Emmit Pomfret, RN  Height: 4\' 11"  (1.499 m)  Cup Size: D  BMI:  Body mass index is 28.26 kg/(m^2). Weight:  140 lb (63.504 kg)   Tech Comments:  N/A    Nuclear Med Study 1 or 2 day study: 1 day  Stress Test Type:  Stress  Order Authorizing Provider:  Thurmon Fair, MD   Resting Radionuclide: Technetium 69m Sestamibi  Resting Radionuclide Dose: 10.3 mCi   Stress Radionuclide:  Technetium 73m Sestamibi  Stress Radionuclide Dose: 28.6 mCi           Stress Protocol Rest HR: 67 Stress HR: 139  Rest BP: 140/91 Stress BP: 179/92  Exercise Time (min): 9:00 METS: 10.1   Predicted Max HR: 158 bpm % Max HR: 87.97 bpm Rate Pressure Product: 21308  Dose of Adenosine (mg):  n/a Dose of Lexiscan: n/a mg  Dose of Atropine (mg): n/a Dose of Dobutamine: n/a mcg/kg/min (at max HR)  Stress Test Technologist: Esperanza Sheets, CCT Nuclear Technologist: Koren Shiver, CNMT   Rest Procedure:  Myocardial perfusion imaging was performed at rest 45 minutes following the intravenous administration of Technetium 28m Sestamibi. Myocardial perfusion imaging was performed at rest 45 minutes following the intravenous administration of Technetium 99 m Sestambi Stress Procedure:  The patient performed treadmill exercise using a Bruce  Protocol for 9:00 minutes.  The patient stopped due to Fatigue  and denied any chest pain.  There were no significant ST-T wave changes.  Technetium 90m Sestamibi was injected at peak exercise and myocardial perfusion imaging was performed after a brief delay.  Transient Ischemic Dilatation (Normal <1.22):  0.94 Lung/Heart Ratio (Normal <0.45):  0.40 QGS EDV:  49 ml QGS ESV:  11 ml LV Ejection Fraction: 78%  Signed by    Rest ECG: NSR - Normal EKG  Stress ECG: No significant change from baseline ECG  QPS Raw Data Images:  Normal; no motion artifact; normal heart/lung ratio. Stress Images:  Normal homogeneous uptake in all areas of the myocardium. Rest Images:  Normal homogeneous uptake in all areas of the myocardium. Subtraction (SDS):  No evidence of ischemia.  Impression Exercise Capacity:  Good exercise capacity. BP Response:  Normal blood pressure response. Clinical Symptoms:  Mild chest pain/dyspnea. ECG Impression:  No significant ST segment change suggestive of ischemia. Comparison with Prior Nuclear Study: No images to compare  Overall Impression:  Normal stress nuclear study.  LV Wall Motion:  NL LV Function; NL Wall Motion   Runell Gess, MD  08/19/2012 5:23 PM

## 2012-08-23 ENCOUNTER — Telehealth: Payer: Self-pay | Admitting: *Deleted

## 2012-08-23 NOTE — Telephone Encounter (Signed)
Message copied by Vita Barley on Tue Aug 23, 2012  2:04 PM ------      Message from: Thurmon Fair      Created: Sat Aug 20, 2012  8:22 AM       Normal study. Please let the patient know. Send copy to PCP Phillips Odor, [place]) ------

## 2012-08-23 NOTE — Telephone Encounter (Signed)
Pt. notified-normal Myoview with verbal understanding.  No changes made.

## 2012-08-29 ENCOUNTER — Telehealth: Payer: Self-pay | Admitting: *Deleted

## 2012-08-29 NOTE — Telephone Encounter (Signed)
Results of nuc study given to pts. Husband who said they received a letter about the results

## 2012-12-20 ENCOUNTER — Other Ambulatory Visit (HOSPITAL_COMMUNITY): Payer: Self-pay | Admitting: Family Medicine

## 2012-12-20 DIAGNOSIS — Z139 Encounter for screening, unspecified: Secondary | ICD-10-CM

## 2012-12-23 ENCOUNTER — Ambulatory Visit: Payer: Commercial Managed Care - PPO | Admitting: Internal Medicine

## 2012-12-26 ENCOUNTER — Ambulatory Visit (INDEPENDENT_AMBULATORY_CARE_PROVIDER_SITE_OTHER): Payer: 59 | Admitting: Gastroenterology

## 2012-12-26 ENCOUNTER — Telehealth: Payer: Self-pay | Admitting: Internal Medicine

## 2012-12-26 ENCOUNTER — Other Ambulatory Visit (INDEPENDENT_AMBULATORY_CARE_PROVIDER_SITE_OTHER): Payer: 59

## 2012-12-26 ENCOUNTER — Ambulatory Visit (INDEPENDENT_AMBULATORY_CARE_PROVIDER_SITE_OTHER)
Admission: RE | Admit: 2012-12-26 | Discharge: 2012-12-26 | Disposition: A | Discharge status: Home / Self Care | Payer: 59 | Source: Ambulatory Visit | Attending: Gastroenterology | Admitting: Gastroenterology

## 2012-12-26 ENCOUNTER — Encounter: Payer: Self-pay | Admitting: Gastroenterology

## 2012-12-26 VITALS — BP 100/68 | HR 92 | Temp 98.2°F | Ht 59.5 in | Wt 136.0 lb

## 2012-12-26 DIAGNOSIS — Z8719 Personal history of other diseases of the digestive system: Secondary | ICD-10-CM

## 2012-12-26 DIAGNOSIS — R1032 Left lower quadrant pain: Secondary | ICD-10-CM

## 2012-12-26 LAB — CBC WITH DIFFERENTIAL/PLATELET
Basophils Absolute: 0 10*3/uL (ref 0.0–0.1)
Eosinophils Absolute: 0.1 10*3/uL (ref 0.0–0.7)
Hemoglobin: 15.6 g/dL — ABNORMAL HIGH (ref 12.0–15.0)
Lymphocytes Relative: 13.8 % (ref 12.0–46.0)
MCHC: 34.2 g/dL (ref 30.0–36.0)
MCV: 87 fl (ref 78.0–100.0)
Monocytes Absolute: 1.4 10*3/uL — ABNORMAL HIGH (ref 0.1–1.0)
Neutro Abs: 9.9 10*3/uL — ABNORMAL HIGH (ref 1.4–7.7)
RDW: 14.2 % (ref 11.5–14.6)

## 2012-12-26 LAB — BASIC METABOLIC PANEL
CO2: 28 mEq/L (ref 19–32)
Calcium: 9.2 mg/dL (ref 8.4–10.5)
Creatinine, Ser: 0.7 mg/dL (ref 0.4–1.2)
Glucose, Bld: 83 mg/dL (ref 70–99)
Sodium: 137 mEq/L (ref 135–145)

## 2012-12-26 MED ORDER — IOHEXOL 300 MG/ML  SOLN
100.0000 mL | Freq: Once | INTRAMUSCULAR | Status: AC | PRN
Start: 2012-12-26 — End: 2012-12-26
  Administered 2012-12-26: 100 mL via INTRAVENOUS

## 2012-12-26 MED ORDER — CIPROFLOXACIN HCL 500 MG PO TABS: 500.0000 mg | ORAL_TABLET | Freq: Two times a day (BID) | ORAL | Status: DC

## 2012-12-26 MED ORDER — METRONIDAZOLE 500 MG PO TABS: 500.0000 mg | ORAL_TABLET | Freq: Three times a day (TID) | ORAL | Status: DC

## 2012-12-26 NOTE — Telephone Encounter (Signed)
Pt thinks she is having a diverticulitis flare. States that her abdomen is sore and painful. Pt scheduled to see Doug Sou PA today at 9:30am. Pt aware of appt date and time.

## 2012-12-26 NOTE — Progress Notes (Signed)
12/26/2012 Shelley Petersen [id number] [birth year]   History of Present Illness:  Patient is a pleasant 62 year old white female who is a patient of Dr. Lamar Sprinkles.  She was added to my schedule today as an urgent visit due to complaints of LLQ abdominal pain.  Says that this began 4 days ago and feels like a diverticulitis flare.  Says that she had diverticulitis about 2 years ago and had a CT scan at [facility].  Pain is constant but worse with walking, stretching, and having BM.  Says that it is a 6/10 now.  No nausea or vomiting.  Says that she noticed a low grade fever at night, but temperature here today is normal.  Current Medications, Allergies, Past Medical History, Past Surgical History, Family History and Social History were reviewed in [employer] record.   Physical Exam: BP 100/68  Pulse 92  Temp(Src) 98.2 F (36.8 C)  Ht 4' 11.5" (1.511 m)  Wt 136 lb (61.689 kg)  BMI 27.02 kg/m2 General: Well developed, white female in no acute distress Head: Normocephalic and atraumatic Eyes:  sclerae anicteric, conjunctiva pink  Ears: Normal auditory acuity Lungs: Clear throughout to auscultation Heart: Regular rate and rhythm Abdomen: Soft, non-distended.  BS present but quiet/hypoative.  Moderate TTP in LLQ.   Musculoskeletal: Symmetrical with no gross deformities  Extremities: No edema  Neurological: Alert oriented x 4, grossly nonfocal Psychological:  Alert and cooperative. Normal mood and affect  Assessment and Recommendations: -LLQ abdominal pain, suspect diverticulitis:  She's had this in the past.  Will begin empiric antibiotics with cipro 500 mg BID and flagyl 500 mg TID for ten days.  Will see if we can get a CT scan of the abdomen and pelvis with contrast today or tomorrow to document the disease.  Check CBC and BMET today.

## 2012-12-26 NOTE — Progress Notes (Signed)
Agree with initial assessment and plans as outlined 

## 2012-12-26 NOTE — Patient Instructions (Addendum)
Your physician has requested that you go to the basement for the following lab work before leaving today:  CBC, BMET  We have sent the following medications to your pharmacy for you to pick up at your convenience:  Flaygl, Cipro     You have been scheduled for a CT scan of the abdomen and pelvis at [facility] ([address]---this is in the same building as Architectural technologist).   You are scheduled on 12-26-12 at 2:00pm. You should arrive 15 minutes prior to your appointment time for registration. Please follow the written instructions below on the day of your exam:  WARNING: IF YOU ARE ALLERGIC TO IODINE/X-RAY DYE, PLEASE NOTIFY RADIOLOGY IMMEDIATELY AT [phone]! YOU WILL BE GIVEN A 13 HOUR PREMEDICATION PREP.  1) Do not eat or drink anything after 10:00am (4 hours prior to your test) 2) You have been given 2 bottles of oral contrast to drink. The solution may taste better if refrigerated, but do NOT add ice or any other liquid to this solution. Shake well before drinking.    Drink 1 bottle of contrast @ 12:00pm(2 hours prior to your exam)  Drink 1 bottle of contrast @ 1:00pm (1 hour prior to your exam)  You may take any medications as prescribed with a small amount of water except for the following: Metformin, Glucophage, Glucovance, Avandamet, Riomet, Fortamet, Actoplus Met, Janumet, Glumetza or Metaglip. The above medications must be held the day of the exam AND 48 hours after the exam.  The purpose of you drinking the oral contrast is to aid in the visualization of your intestinal tract. The contrast solution may cause some diarrhea. Before your exam is started, you will be given a small amount of fluid to drink. Depending on your individual set of symptoms, you may also receive an intravenous injection of x-ray contrast/dye. Plan on being at [facility] for 30 minutes or long, depending on the type of exam you are having performed.  If you have any questions  regarding your exam or if you need to reschedule, you may call the CT department at [phone] between the hours of 8:00 am and 5:00 pm, Monday-Friday.  ________________________________________________________________________

## 2012-12-28 ENCOUNTER — Telehealth: Payer: Self-pay | Admitting: Internal Medicine

## 2012-12-28 NOTE — Telephone Encounter (Signed)
Pt states she needs a letter for missing work due to diverticulitis. Letter mailed to pt per pt request.

## 2013-01-31 ENCOUNTER — Ambulatory Visit (HOSPITAL_COMMUNITY)
Admission: RE | Admit: 2013-01-31 | Discharge: 2013-01-31 | Disposition: A | Discharge status: Home / Self Care | Payer: 59 | Source: Ambulatory Visit | Attending: Family Medicine | Admitting: Family Medicine

## 2013-01-31 ENCOUNTER — Encounter: Payer: Self-pay | Admitting: Internal Medicine

## 2013-01-31 DIAGNOSIS — Z139 Encounter for screening, unspecified: Secondary | ICD-10-CM

## 2013-01-31 DIAGNOSIS — Z1231 Encounter for screening mammogram for malignant neoplasm of breast: Secondary | ICD-10-CM | POA: Insufficient documentation

## 2013-02-01 NOTE — Telephone Encounter (Signed)
Left message for pt to call back  °

## 2013-02-01 NOTE — Telephone Encounter (Signed)
Spoke with pt and she had questions about the CT scan she had a couple of weeks ago. Pts questions answered.

## 2013-02-16 ENCOUNTER — Other Ambulatory Visit: Payer: Self-pay

## 2013-03-21 ENCOUNTER — Other Ambulatory Visit: Payer: Self-pay | Admitting: Gastroenterology

## 2013-03-22 ENCOUNTER — Emergency Department (HOSPITAL_COMMUNITY): Payer: 59

## 2013-03-22 ENCOUNTER — Emergency Department (HOSPITAL_COMMUNITY)
Admission: EM | Admit: 2013-03-22 | Discharge: 2013-03-22 | Disposition: A | Discharge status: Home / Self Care | Payer: 59 | Attending: Emergency Medicine | Admitting: Emergency Medicine

## 2013-03-22 ENCOUNTER — Encounter (HOSPITAL_COMMUNITY): Payer: Self-pay | Admitting: Emergency Medicine

## 2013-03-22 DIAGNOSIS — Z792 Long term (current) use of antibiotics: Secondary | ICD-10-CM | POA: Insufficient documentation

## 2013-03-22 DIAGNOSIS — R059 Cough, unspecified: Secondary | ICD-10-CM | POA: Insufficient documentation

## 2013-03-22 DIAGNOSIS — Z860100 Personal history of colon polyps, unspecified: Secondary | ICD-10-CM | POA: Insufficient documentation

## 2013-03-22 DIAGNOSIS — I10 Essential (primary) hypertension: Secondary | ICD-10-CM | POA: Insufficient documentation

## 2013-03-22 DIAGNOSIS — K5732 Diverticulitis of large intestine without perforation or abscess without bleeding: Secondary | ICD-10-CM | POA: Insufficient documentation

## 2013-03-22 DIAGNOSIS — R6883 Chills (without fever): Secondary | ICD-10-CM | POA: Insufficient documentation

## 2013-03-22 DIAGNOSIS — R21 Rash and other nonspecific skin eruption: Secondary | ICD-10-CM | POA: Insufficient documentation

## 2013-03-22 DIAGNOSIS — Z7982 Long term (current) use of aspirin: Secondary | ICD-10-CM | POA: Insufficient documentation

## 2013-03-22 DIAGNOSIS — R51 Headache: Secondary | ICD-10-CM | POA: Insufficient documentation

## 2013-03-22 DIAGNOSIS — K219 Gastro-esophageal reflux disease without esophagitis: Secondary | ICD-10-CM | POA: Insufficient documentation

## 2013-03-22 DIAGNOSIS — K5792 Diverticulitis of intestine, part unspecified, without perforation or abscess without bleeding: Secondary | ICD-10-CM

## 2013-03-22 DIAGNOSIS — R05 Cough: Secondary | ICD-10-CM | POA: Insufficient documentation

## 2013-03-22 DIAGNOSIS — Z79899 Other long term (current) drug therapy: Secondary | ICD-10-CM | POA: Insufficient documentation

## 2013-03-22 LAB — COMPREHENSIVE METABOLIC PANEL
AST: 21 U/L (ref 0–37)
Albumin: 4.1 g/dL (ref 3.5–5.2)
Calcium: 9.3 mg/dL (ref 8.4–10.5)
Chloride: 100 mEq/L (ref 96–112)
Creatinine, Ser: 1.01 mg/dL (ref 0.50–1.10)
Total Bilirubin: 0.7 mg/dL (ref 0.3–1.2)
Total Protein: 8.2 g/dL (ref 6.0–8.3)

## 2013-03-22 LAB — CBC WITH DIFFERENTIAL/PLATELET
Eosinophils Relative: 0 % (ref 0–5)
HCT: 43.4 % (ref 36.0–46.0)
Hemoglobin: 14.7 g/dL (ref 12.0–15.0)
Lymphocytes Relative: 26 % (ref 12–46)
Lymphs Abs: 2.3 10*3/uL (ref 0.7–4.0)
MCV: 90.6 fL (ref 78.0–100.0)
Monocytes Absolute: 1 10*3/uL (ref 0.1–1.0)
Neutro Abs: 5.5 10*3/uL (ref 1.7–7.7)
RBC: 4.79 MIL/uL (ref 3.87–5.11)
WBC: 8.8 10*3/uL (ref 4.0–10.5)

## 2013-03-22 LAB — LIPASE, BLOOD: Lipase: 36 U/L (ref 11–59)

## 2013-03-22 MED ORDER — SODIUM CHLORIDE 0.9 % IV SOLN
INTRAVENOUS | Status: DC
  Administered 2013-03-22: 18:00:00 via INTRAVENOUS

## 2013-03-22 MED ORDER — PROMETHAZINE HCL 25 MG PO TABS: 25.0000 mg | ORAL_TABLET | Freq: Four times a day (QID) | ORAL | Status: DC | PRN

## 2013-03-22 MED ORDER — ONDANSETRON HCL 4 MG/2ML IJ SOLN
4.0000 mg | Freq: Once | INTRAMUSCULAR | Status: AC
  Administered 2013-03-22: 4 mg via INTRAVENOUS
  Filled 2013-03-22: qty 2

## 2013-03-22 MED ORDER — PROMETHAZINE HCL 25 MG/ML IJ SOLN
12.5000 mg | Freq: Once | INTRAMUSCULAR | Status: AC
  Administered 2013-03-22: 12.5 mg via INTRAVENOUS
  Filled 2013-03-22: qty 1

## 2013-03-22 MED ORDER — HYDROMORPHONE HCL PF 1 MG/ML IJ SOLN
1.0000 mg | Freq: Once | INTRAMUSCULAR | Status: AC
  Administered 2013-03-22: 1 mg via INTRAVENOUS
  Filled 2013-03-22: qty 1

## 2013-03-22 MED ORDER — SODIUM CHLORIDE 0.9 % IV BOLUS (SEPSIS)
500.0000 mL | Freq: Once | INTRAVENOUS | Status: AC
  Administered 2013-03-22: 500 mL via INTRAVENOUS

## 2013-03-22 MED ORDER — IOHEXOL 300 MG/ML  SOLN
50.0000 mL | Freq: Once | INTRAMUSCULAR | Status: AC | PRN
  Administered 2013-03-22: 50 mL via ORAL

## 2013-03-22 MED ORDER — ONDANSETRON HCL 4 MG/2ML IJ SOLN: 4.0000 mg | Freq: Once | INTRAMUSCULAR | Status: DC

## 2013-03-22 MED ORDER — AMPICILLIN-SULBACTAM SODIUM 3 (2-1) G IJ SOLR
3.0000 g | Freq: Once | INTRAMUSCULAR | Status: AC
  Administered 2013-03-22: 3 g via INTRAVENOUS
  Filled 2013-03-22: qty 3

## 2013-03-22 MED ORDER — IOHEXOL 300 MG/ML  SOLN
100.0000 mL | Freq: Once | INTRAMUSCULAR | Status: AC | PRN
  Administered 2013-03-22: 100 mL via INTRAVENOUS

## 2013-03-22 MED ORDER — ONDANSETRON 4 MG PO TBDP: 4.0000 mg | ORAL_TABLET | Freq: Three times a day (TID) | ORAL | Status: DC | PRN

## 2013-03-22 MED ORDER — AMOXICILLIN-POT CLAVULANATE 875-125 MG PO TABS: 1.0000 | ORAL_TABLET | Freq: Two times a day (BID) | ORAL | Status: DC

## 2013-03-22 NOTE — ED Notes (Signed)
Pt has HX of Diverticulosis, had an episode 6 weeks prior, feels the same way, pt co lower abdominal pain. Pt also complains of headache and cough.

## 2013-03-22 NOTE — ED Notes (Signed)
Pt refusing pain and nausea meds at this time.

## 2013-03-22 NOTE — Discharge Instructions (Signed)
Diverticulitis Small pockets or "bubbles" can develop in the wall of the intestine. Diverticulitis is when those pockets become infected and inflamed. This causes stomach pain (usually on the left side). HOME CARE  Take all medicine as told by your doctor.  Try a clear liquid diet (broth, tea, or water) for as long as told by your doctor.  Keep all follow-up visits with your doctor.  You may be put on a low-fiber diet once you start feeling better. Here are foods that have low-fiber:  White breads, cereals, rice, and pasta.  Cooked fruits and vegetables or soft fresh fruits and vegetables without the skin.  Ground or well-cooked tender beef, ham, veal, lamb, pork, or poultry.  Eggs and seafood.  After you are doing well on the low-fiber diet, you may be put on a high-fiber diet. Here are ways to increase your fiber:  Choose whole-grain breads, cereals, pasta, and brown rice.  Choose fruits and vegetables with skin on. Do not overcook the vegetables.  Choose nuts, seeds, legumes, dried peas, beans, and lentils.  Look for food products that have more than 3 grams of fiber per serving on the food label. GET HELP RIGHT AWAY IF:  Your pain does not get better or gets worse.  You have trouble eating food.  You are not pooping (having bowel movements) like normal.  You have a temperature by mouth above 102 F (38.9 C), not controlled by medicine.  You keep throwing up (vomiting).  You have bloody or black, tarry poop (stools).  You are getting worse and not better. MAKE SURE YOU:   Understand these instructions.  Will watch your condition.  Will get help right away if you are not doing well or get worse. Document Released: 09/16/2007 Document Revised: 06/22/2011 Document Reviewed: 02/18/2009 [facility] Patient Information 2014 [place], [place].  Important to take the antibiotics as prescribed. If vomiting persists in your unable to keep antibiotics down you will not get  better we'll need to return. If not improving in 2 days will need to return. Return for any new or worse symptoms worse abdominal pain worse fever persistent vomiting. Make an appointment to followup with your GI Dr. as soon as possible. Return as stated for any new or worse symptoms. Take antinausea medicine to types prescribed to help prevent the vomiting.

## 2013-03-22 NOTE — ED Notes (Signed)
Pt c/o lower abdominal pain and nausea since last night. Describes pain as sharp. Pt has hx of diverticulitis and states these symptoms are similar with past episodes. Pt denies V/D.

## 2013-03-22 NOTE — ED Notes (Signed)
Family at bedside. Patient up to bedside commode. Back in bed at this time. States that she feels a little better since get medicine for nausea.

## 2013-03-22 NOTE — ED Provider Notes (Addendum)
CSN: [id number]     Arrival date & time 03/22/13  1606 History  This chart was scribed for Shelda Jakes, MD by Dorothey Baseman, ED Scribe. This patient was seen in room [facility] and the patient's care was started at 4:42 PM.    Chief Complaint  Patient presents with  . Abdominal Pain   Patient is a 62 y.o. female presenting with abdominal pain. The history is provided by the patient. No language interpreter was used.  Abdominal Pain Pain location:  LLQ and RLQ Pain radiates to:  Back Pain severity:  Moderate Onset quality:  Sudden Timing:  Constant Chronicity:  Recurrent Worsened by:  Coughing and movement Associated symptoms: chills, cough and nausea   Associated symptoms: no chest pain, no diarrhea, no dysuria, no fever, no shortness of breath and no sore throat    HPI Comments: Shelley Petersen is a 63 y.o. female with a history of diverticulosis (last episode was 6 weeks ago) who presents to the Emergency Department complaining of a constant pain, 4/10 currently, to the lower abdomen, greater on the left, onset yesterday that radiates to the back. She reports that the pain is exacerbated with walking and coughing. Patient reports that her current pain feels similar to her past episodes of diverticulosis. She reports associated nausea, headache, chills, congestion, and cough. Patient also reports a rash to the chest and left, lower leg that she states may be due to her psoriasis that was exacerbated by the cold weather. She denies emesis, diarrhea, fever, visual disturbance, sore throat, chest pain, shortness of breath, dysuria, leg swelling. She denies history of bleeding easily. Patient also has a history of GERD, hemorrhoids, hernia, and colon polyp.   Past Medical History  Diagnosis Date  . Hypertension   . GERD (gastroesophageal reflux disease)   . Diverticulosis   . Hemorrhoids   . Esophageal stricture   . Hiatal hernia   . Hyperplastic colon polyp    Past Surgical History   Procedure Laterality Date  . Tonsillectomy    . Shoulder spur surgery Right   . Dental implants    . Knee arthroscopy w/ meniscal repair Left   . Ankle surgery Left   . Abdominal hysterectomy     Family History  Problem Relation Age of Onset  . Breast cancer Mother   . Stomach cancer Father   . Diabetes Sister     twin  . Diabetes Brother   . Diabetes Father   . Heart disease Father    History  Substance Use Topics  . Smoking status: Never Smoker   . Smokeless tobacco: Never Used  . Alcohol Use: No   OB History   Grav Para Term Preterm Abortions TAB SAB Ect Mult Living                 Review of Systems  Constitutional: Positive for chills. Negative for fever.  HENT: Positive for congestion. Negative for sore throat.   Eyes: Negative for visual disturbance.  Respiratory: Positive for cough. Negative for shortness of breath.   Cardiovascular: Negative for chest pain and leg swelling.  Gastrointestinal: Positive for nausea and abdominal pain. Negative for diarrhea.  Genitourinary: Negative for dysuria.  Skin: Positive for rash.  Neurological: Positive for headaches.  Psychiatric/Behavioral: Negative for confusion.    Allergies  Acetaminophen-codeine; Azithromycin; Doxycycline; Oxycodone-acetaminophen; and Tramadol  Home Medications   Current Outpatient Rx  Name  Route  Sig  Dispense  Refill  . AMBULATORY NON FORMULARY  MEDICATION      Medication Name: Kitamin K 40 mg/D3 5000 IU Take 1 tablet by mouth once daily         . aspirin 81 MG tablet   Oral   Take 81 mg by mouth daily.         . ciprofloxacin (CIPRO) 500 MG tablet   Oral   Take 1 tablet (500 mg total) by mouth 2 (two) times daily.   20 tablet   0   . Flaxseed, Linseed, (FLAXSEED OIL PO)   Oral   Take by mouth. 2400 mg/omega 3 Take 1 capsule once daily         . hydrochlorothiazide (HYDRODIURIL) 25 MG tablet   Oral   Take 25 mg by mouth every other day.         . lisinopril  (PRINIVIL,ZESTRIL) 10 MG tablet   Oral   Take 10 mg by mouth every other day.         Marland Kitchen omeprazole (PRILOSEC OTC) 20 MG tablet   Oral   Take 20 mg by mouth daily.         Marland Kitchen oxybutynin (DITROPAN-XL) 10 MG 24 hr tablet   Oral   Take 10 mg by mouth at bedtime.         . triamcinolone cream (KENALOG) 0.1 %   Topical   Apply 1 application topically as needed (for skin irritation).           Triage Vitals: BP 113/81  Pulse 96  Temp(Src) 97.5 F (36.4 C) (Oral)  Resp 20  Ht 4\' 11"  (1.499 m)  Wt 135 lb (61.236 kg)  BMI 27.25 kg/m2  SpO2 98%  Physical Exam  Nursing note and vitals reviewed. Constitutional: She is oriented to person, place, and time. She appears well-developed and well-nourished. No distress.  HENT:  Head: Normocephalic and atraumatic.  Mouth/Throat: Oropharynx is clear and moist.  Eyes: Conjunctivae are normal.  Neck: Normal range of motion. Neck supple.  Cardiovascular: Normal rate, regular rhythm and normal heart sounds.  Exam reveals no gallop and no friction rub.   No murmur heard. Pulmonary/Chest: Effort normal. No respiratory distress.  Abdominal: Soft. Bowel sounds are normal. She exhibits no distension. There is tenderness. There is no rebound and no guarding.  Mild RLQ tenderness and greater LLQ tenderness. No epigastric tenderness.   Musculoskeletal: Normal range of motion.  Neurological: She is alert and oriented to person, place, and time. No cranial nerve deficit. She exhibits normal muscle tone. Coordination normal.  Skin: Skin is warm and dry.  Psychiatric: She has a normal mood and affect. Her behavior is normal.    ED Course  Procedures (including critical care time)  DIAGNOSTIC STUDIES: Oxygen Saturation is 98% on room air, normal by my interpretation.    COORDINATION OF CARE: 4:46 PM- Will order a CT of the abdomen, chest x-ray, and blood labs. Will order IV fluids, Omnipaque, Zofran, and Dilaudid to manage symptoms. Discussed  treatment plan with patient at bedside and patient verbalized agreement.     Labs Review Labs Reviewed  COMPREHENSIVE METABOLIC PANEL - Abnormal; Notable for the following:    GFR calc non Af Amer 58 (*)    GFR calc Af Amer 68 (*)    All other components within normal limits  LIPASE, BLOOD  CBC WITH DIFFERENTIAL   Results for orders placed during the hospital encounter of 03/22/13  COMPREHENSIVE METABOLIC PANEL      Result Value Range  Sodium 139  135 - 145 mEq/L   Potassium 3.5  3.5 - 5.1 mEq/L   Chloride 100  96 - 112 mEq/L   CO2 27  19 - 32 mEq/L   Glucose, Bld 84  70 - 99 mg/dL   BUN 11  6 - 23 mg/dL   Creatinine, Ser 2.13  0.50 - 1.10 mg/dL   Calcium 9.3  8.4 - 08.6 mg/dL   Total Protein 8.2  6.0 - 8.3 g/dL   Albumin 4.1  3.5 - 5.2 g/dL   AST 21  0 - 37 U/L   ALT 23  0 - 35 U/L   Alkaline Phosphatase 77  39 - 117 U/L   Total Bilirubin 0.7  0.3 - 1.2 mg/dL   GFR calc non Af Amer 58 (*) >90 mL/min   GFR calc Af Amer 68 (*) >90 mL/min  LIPASE, BLOOD      Result Value Range   Lipase 36  11 - 59 U/L  CBC WITH DIFFERENTIAL      Result Value Range   WBC 8.8  4.0 - 10.5 K/uL   RBC 4.79  3.87 - 5.11 MIL/uL   Hemoglobin 14.7  12.0 - 15.0 g/dL   HCT 57.8  46.9 - 62.9 %   MCV 90.6  78.0 - 100.0 fL   MCH 30.7  26.0 - 34.0 pg   MCHC 33.9  30.0 - 36.0 g/dL   RDW 52.8  41.3 - 24.4 %   Platelets 226  150 - 400 K/uL   Neutrophils Relative % 62  43 - 77 %   Neutro Abs 5.5  1.7 - 7.7 K/uL   Lymphocytes Relative 26  12 - 46 %   Lymphs Abs 2.3  0.7 - 4.0 K/uL   Monocytes Relative 11  3 - 12 %   Monocytes Absolute 1.0  0.1 - 1.0 K/uL   Eosinophils Relative 0  0 - 5 %   Eosinophils Absolute 0.0  0.0 - 0.7 K/uL   Basophils Relative 0  0 - 1 %   Basophils Absolute 0.0  0.0 - 0.1 K/uL    Imaging Review Dg Chest 2 View  03/22/2013   CLINICAL DATA:  Pain and nausea  EXAM: CHEST  2 VIEW  COMPARISON:  None.  FINDINGS: There is no edema or consolidation. Heart size and pulmonary  vascularity are normal. No adenopathy. No bone lesions.  IMPRESSION: No edema or consolidation.   Electronically Signed   By: Bretta Bang M.D.   On: 03/22/2013 18:54   Ct Abdomen Pelvis W Contrast  03/22/2013   CLINICAL DATA:  Lower abdominal pain radiating to the back. Diverticulitis 6 weeks ago.  EXAM: CT ABDOMEN AND PELVIS WITH CONTRAST  TECHNIQUE: Multidetector CT imaging of the abdomen and pelvis was performed using the standard protocol following bolus administration of intravenous contrast.  CONTRAST:  50mL OMNIPAQUE IOHEXOL 300 MG/ML SOLN, OMNIPAQUE IOHEXOL 300 MG/ML SOLN  COMPARISON:  12/26/2012  FINDINGS: Stable appearance of multiple up attic cysts, some of which may be septated. No enhancing nodular components.  Spleen, pancreas, and adrenal glands unremarkable. No pathologic upper abdominal adenopathy is observed. Kidneys and proximal ureters unremarkable.  Appendix not well seen.  Scattered colonic diverticula.  There is focal abnormal wall thickening and mild mesenteric stranding in the sigmoid colon data point about is 6 cm distal to the prior site of diverticulitis. This appears to involve a different diverticulum on image 70 of series 2. Appearance is  compatible with diverticulitis at a more distal point in the sigmoid colon. No extraluminal gas or abscess observed. No pathologic pelvic adenopathy is observed.  IMPRESSION: *Acute sigmoid colon diverticulitis, about 6 cm distal to the prior site of diverticulitis. No abscess or extraluminal gas. *Scattered hepatic cysts, no change from prior.   Electronically Signed   By: Herbie Baltimore M.D.   On: 03/22/2013 18:55    EKG Interpretation   None       MDM   1. Diverticulitis    CT scan is consistent with the sigmoid diverticulitis as per the patient's concerns. No complicating factors. No significant leukocytosis. Patient will be treated with Unasyn IV piggyback in the emergency department. Will most likely be able to be  treated as an outpatient and followup with her regular Dr. and GI Dr.   Patient with improvement still feeling nauseated or vomiting under control. Patient given the option she wants to give a trial at home. Patient knows to return if vomiting persists and she can take her antibiotic were she's not better in a couple days. Patient already followed by GI medicine has an appointment upcoming.  I personally performed the services described in this documentation, which was scribed in my presence. The recorded information has been reviewed and is accurate.      Shelda Jakes, MD 03/22/13 [id number]  Shelda Jakes, MD 03/22/13 [id number]

## 2013-03-24 ENCOUNTER — Encounter: Payer: Self-pay | Admitting: Nurse Practitioner

## 2013-03-24 ENCOUNTER — Ambulatory Visit (INDEPENDENT_AMBULATORY_CARE_PROVIDER_SITE_OTHER): Payer: 59 | Admitting: Nurse Practitioner

## 2013-03-24 ENCOUNTER — Other Ambulatory Visit: Payer: 59

## 2013-03-24 VITALS — BP 124/70 | HR 70 | Ht 63.0 in | Wt 136.0 lb

## 2013-03-24 DIAGNOSIS — K5732 Diverticulitis of large intestine without perforation or abscess without bleeding: Secondary | ICD-10-CM

## 2013-03-24 DIAGNOSIS — R197 Diarrhea, unspecified: Secondary | ICD-10-CM

## 2013-03-24 MED ORDER — TRAMADOL HCL 50 MG PO TABS: 50.0000 mg | ORAL_TABLET | Freq: Four times a day (QID) | ORAL | Status: DC | PRN

## 2013-03-24 NOTE — Patient Instructions (Signed)
We have sent the following medications to your pharmacy for you to pick up at your convenience: Ultram Finish Augmentin  Your physician has requested that you go to the basement for the following lab work before leaving today: Call if you have recurrent pain and/or fever. Follow up with Dr Marina Goodell in 2 months. CC:  Assunta Found MD

## 2013-03-24 NOTE — Progress Notes (Signed)
Agree with assessment and plans 

## 2013-03-24 NOTE — Progress Notes (Signed)
HPI :  Shelley Petersen is a 62 year old female followed by Dr. Marina Goodell for history of GERD, incidental esophageal stricture, IBS, and diverticular disease. She had an episode of sigmoid diverticulitis February 2012. We saw her in mid September of this year with abdominal pain, repeat CT scan was again compatible with acute sigmoid diverticulitis. Shelley Petersen was treated with a ten-day course of Cipro and Flagyl, her symptoms totally resolved. Last week Shelley Petersen developed recurrent left lower quadrant pain and was evaluated in the emergency department at [facility] where another CT showed recurrent sigmoid diverticulitis in an area approximately 6 cm from previous site. Shelley Petersen was prescribed Augmentin and is here for followup today.  She is feeling better though still having some lower abdominal discomfort. No chills or fever. Shelley Petersen does describe loose stool which started while getting IV antibiotics during emergency department visit 2 days ago  She and husband are concerned about this being the third occurrence of diverticulitis in less than 2 years. Her last complete colonoscopy was in 2008 with findings of severe diverticulosis, hemorrhoids, and hyperplastic polyp   Past Medical History  Diagnosis Date  . Hypertension   . GERD (gastroesophageal reflux disease)   . Hemorrhoids   . Esophageal stricture   . Hiatal hernia   . Hyperplastic colon polyp   . Diverticulitis     Family History  Problem Relation Age of Onset  . Breast cancer Mother   . Stomach cancer Father   . Diabetes Sister     twin  . Diabetes Brother   . Diabetes Father   . Heart disease Father    History  Substance Use Topics  . Smoking status: Never Smoker   . Smokeless tobacco: Never Used  . Alcohol Use: No   Current Outpatient Prescriptions  Medication Sig Dispense Refill  . AMBULATORY NON FORMULARY MEDICATION Medication Name: Kitamin K 40 mg/D3 5000 IU Take 1 tablet by mouth once daily      . amoxicillin-clavulanate  (AUGMENTIN) 875-125 MG per tablet Take 1 tablet by mouth every 12 (twelve) hours.  20 tablet  0  . aspirin 81 MG tablet Take 81 mg by mouth daily.      . Flaxseed, Linseed, (FLAXSEED OIL PO) Take by mouth. 2400 mg/omega 3 Take 1 capsule once daily      . hydrochlorothiazide (HYDRODIURIL) 25 MG tablet Take 25 mg by mouth every other day.      . lisinopril (PRINIVIL,ZESTRIL) 10 MG tablet Take 10 mg by mouth every other day.      Marland Kitchen omeprazole (PRILOSEC OTC) 20 MG tablet Take 20 mg by mouth daily.      . ondansetron (ZOFRAN ODT) 4 MG disintegrating tablet Take 1 tablet (4 mg total) by mouth every 8 (eight) hours as needed.  12 tablet  1  . oxybutynin (DITROPAN-XL) 10 MG 24 hr tablet Take 10 mg by mouth at bedtime.      . promethazine (PHENERGAN) 25 MG tablet Take 1 tablet (25 mg total) by mouth every 6 (six) hours as needed for nausea or vomiting.  20 tablet  1  . triamcinolone cream (KENALOG) 0.1 % Apply 1 application topically as needed (for skin irritation).       . ciprofloxacin (CIPRO) 500 MG tablet Take 1 tablet (500 mg total) by mouth 2 (two) times daily.  20 tablet  0  . traMADol (ULTRAM) 50 MG tablet Take 1 tablet (50 mg total) by mouth every 6 (six) hours as needed.  30 tablet  0   No current facility-administered medications for this visit.   Allergies  Allergen Reactions  . Acetaminophen-Codeine Nausea And Vomiting  . Azithromycin Other (See Comments)    unspecified  . Doxycycline Other (See Comments)    unspecified  . Oxycodone-Acetaminophen Nausea And Vomiting  . Tramadol Nausea And Vomiting    Ct Abdomen Pelvis W Contrast  03/22/2013   CLINICAL DATA:  Lower abdominal pain radiating to the back. Diverticulitis 6 weeks ago.  EXAM: CT ABDOMEN AND PELVIS WITH CONTRAST  TECHNIQUE: Multidetector CT imaging of the abdomen and pelvis was performed using the standard protocol following bolus administration of intravenous contrast.  CONTRAST:  50mL OMNIPAQUE IOHEXOL 300 MG/ML SOLN,  OMNIPAQUE IOHEXOL 300 MG/ML SOLN  COMPARISON:  12/26/2012  FINDINGS: Stable appearance of multiple up attic cysts, some of which may be septated. No enhancing nodular components.  Spleen, pancreas, and adrenal glands unremarkable. No pathologic upper abdominal adenopathy is observed. Kidneys and proximal ureters unremarkable.  Appendix not well seen.  Scattered colonic diverticula.  There is focal abnormal wall thickening and mild mesenteric stranding in the sigmoid colon data point about is 6 cm distal to the prior site of diverticulitis. This appears to involve a different diverticulum on image 70 of series 2. Appearance is compatible with diverticulitis at a more distal point in the sigmoid colon. No extraluminal gas or abscess observed. No pathologic pelvic adenopathy is observed.  IMPRESSION: *Acute sigmoid colon diverticulitis, about 6 cm distal to the prior site of diverticulitis. No abscess or extraluminal gas. *Scattered hepatic cysts, no change from prior.   Electronically Signed   By: Herbie Baltimore M.D.   On: 03/22/2013 18:55    Physical Exam: BP 124/70  Pulse 70  Ht 5\' 3"  (1.6 m)  Wt 136 lb (61.689 kg)  BMI 24.10 kg/m2 Constitutional: Pleasant,well-developed, white female in no acute distress. HEENT: Normocephalic and atraumatic. Conjunctivae are normal. No scleral icterus. Neck supple.  Cardiovascular: Normal rate, regular rhythm.  Pulmonary/chest: Effort normal and breath sounds normal. No wheezing, rales or rhonchi. Abdominal: Soft, nondistended, moderate LLQ tenderness. Bowel sounds active throughout. There are no masses palpable. No hepatomegaly. Extremities: no edema Lymphadenopathy: No cervical adenopathy noted. Neurological: Alert and oriented to person place and time. Skin: Skin is warm and dry. No rashes noted. Psychiatric: Normal mood and affect. Behavior is normal.   ASSESSMENT AND PLAN: 1. Recurrent sigmoid diverticulitis. This is 3rd episode in 1.5 years and  second episode within last 3 months. She may need eventual surgical evaluation. For now recommend:  Completion of course of Augmentin.   Trial of Ultram for pain.   Clear liquids for next couple of days then advance to low fiber diet. After completion of antibiotics, if feeling better may advance to high fiber diet.    follow up with Dr. Marina Goodell in two months. She will call in the interim for recurrent/ persistent pain and /or fevers.   2. Diarrhea,acute. This could be secondary to antibiotics but will check stool to rule out C. difficile

## 2013-05-26 ENCOUNTER — Encounter: Payer: Self-pay | Admitting: Internal Medicine

## 2013-05-26 ENCOUNTER — Ambulatory Visit (INDEPENDENT_AMBULATORY_CARE_PROVIDER_SITE_OTHER): Payer: 59 | Admitting: Internal Medicine

## 2013-05-26 VITALS — BP 126/88 | HR 80 | Ht 59.5 in | Wt 137.4 lb

## 2013-05-26 DIAGNOSIS — R1032 Left lower quadrant pain: Secondary | ICD-10-CM

## 2013-05-26 DIAGNOSIS — K5792 Diverticulitis of intestine, part unspecified, without perforation or abscess without bleeding: Secondary | ICD-10-CM

## 2013-05-26 DIAGNOSIS — K5732 Diverticulitis of large intestine without perforation or abscess without bleeding: Secondary | ICD-10-CM

## 2013-05-26 DIAGNOSIS — K219 Gastro-esophageal reflux disease without esophagitis: Secondary | ICD-10-CM

## 2013-05-26 MED ORDER — AMOXICILLIN-POT CLAVULANATE 875-125 MG PO TABS: 1.0000 | ORAL_TABLET | Freq: Two times a day (BID) | ORAL | Status: DC

## 2013-05-26 NOTE — Progress Notes (Signed)
HISTORY OF PRESENT ILLNESS:  Shelley Petersen is a 63 y.o. female with the below listed medical history who is followed in this office for GERD, incidental esophageal stricture, IBS, and diverticular disease. Patient has had recurrent problems with diverticulitis. Last flare December 2014. She was treated with antibiotics and improved within a few days. She presents today for followup. She is accompanied by her husband. She was doing well until yesterday when she developed recurrent left lower quadrant pain. This persisted day. Stools are a bit looser and mucoid. No diarrhea. No bleeding. No fevers. Previous CT scans have shown sigmoid diverticulitis without complicating features. No other issues  REVIEW OF SYSTEMS:  All non-GI ROS negative except for back pain, headaches, itching, urinary leakage  Past Medical History  Diagnosis Date  . Hypertension   . GERD (gastroesophageal reflux disease)   . Diverticulosis   . Hemorrhoids   . Esophageal stricture   . Hiatal hernia   . Hyperplastic colon polyp   . Diverticulitis     Past Surgical History  Procedure Laterality Date  . Tonsillectomy    . Shoulder spur surgery Right   . Dental implants    . Knee arthroscopy w/ meniscal repair Left   . Ankle surgery Left   . Abdominal hysterectomy      Social History JASREET DICKIE  reports that she has never smoked. She has never used smokeless tobacco. She reports that she does not drink alcohol or use illicit drugs.  family history includes Breast cancer in her mother; Diabetes in her brother, father, and sister; Heart disease in her father; Stomach cancer in her father.  Allergies  Allergen Reactions  . Acetaminophen-Codeine Nausea And Vomiting  . Azithromycin Other (See Comments)    unspecified  . Doxycycline Other (See Comments)    unspecified  . Oxycodone-Acetaminophen Nausea And Vomiting  . Tramadol Nausea And Vomiting       PHYSICAL EXAMINATION: Vital signs: BP 126/88  Pulse 80   Ht 4' 11.5" (1.511 m)  Wt 137 lb 6.4 oz (62.324 kg)  BMI 27.30 kg/m2 General: Well-developed, well-nourished, no acute distress HEENT: Sclerae are anicteric, conjunctiva pink. Oral mucosa intact Lungs: Clear Heart: Regular Abdomen: soft, focal moderate left lower quadrant tenderness to palpation, nondistended, no obvious ascites, no peritoneal signs, normal bowel sounds. No organomegaly. Extremities: No edema Psychiatric: alert and oriented x3. Cooperative   ASSESSMENT:  #1. Acute diverticulitis. Active flare. Multiple prior episodes. #2. GERD. Asymptomatic on omeprazole #3. Last colonoscopy December 2008. Severe pandiverticulosis and hemorrhoids   PLAN:  #1. Prescribed Augmentin 875 mg twice a day x10 days. One refill for future attacks (if she fills this, will contact the office so we can document episode) #2. Increase dietary fiber #3. Continue PPI #4. Routine office followup in 3 months. Could consider surgical consultation if problems with diverticulosis don't settle down

## 2013-05-26 NOTE — Patient Instructions (Signed)
We have sent the following medications to your pharmacy for you to pick up at your convenience:  Augmentin  Please follow up with Dr. Henrene Pastor in 3 months

## 2013-06-06 ENCOUNTER — Telehealth: Payer: Self-pay | Admitting: Internal Medicine

## 2013-06-06 MED ORDER — METRONIDAZOLE 250 MG PO TABS: 250.0000 mg | ORAL_TABLET | Freq: Four times a day (QID) | ORAL | Status: DC

## 2013-06-06 NOTE — Telephone Encounter (Signed)
Could be antibiotic related or possibly C. difficile. Since she cannot come in, treat with metronidazole 250 mg 4 times a day for 7 days. Make sure she keeps [place] posted with her progress one way or the other

## 2013-06-06 NOTE — Telephone Encounter (Signed)
So what's the problem now? Still with diarrhea? Pain? Fever? Other????

## 2013-06-06 NOTE — Telephone Encounter (Signed)
Pt states she has been having liquid diarrhea ever since 2/15.

## 2013-06-06 NOTE — Telephone Encounter (Signed)
Pt was seen 05/26/13 and placed on Augmentin 875mg  bid for 10days. Pt states that on 2/15 she started having liquid diarrhea. Pt has 3 days left on the antibiotic because she states over the weekend she was nauseated and throwing up and couldn't take the med. Pt wants to know what she should do. Offered pt an appt but she states she cannot come in today. Please advise.

## 2013-06-06 NOTE — Telephone Encounter (Signed)
Pt aware and script sent to the pharmacy. 

## 2013-09-27 ENCOUNTER — Other Ambulatory Visit (HOSPITAL_COMMUNITY): Payer: Self-pay | Admitting: Family Medicine

## 2013-09-27 DIAGNOSIS — R51 Headache: Secondary | ICD-10-CM

## 2013-09-28 ENCOUNTER — Other Ambulatory Visit (HOSPITAL_COMMUNITY): Payer: Self-pay | Admitting: Family Medicine

## 2013-09-28 DIAGNOSIS — R51 Headache: Principal | ICD-10-CM

## 2013-09-28 DIAGNOSIS — R42 Dizziness and giddiness: Secondary | ICD-10-CM

## 2013-09-28 DIAGNOSIS — R519 Headache, unspecified: Secondary | ICD-10-CM

## 2013-09-29 ENCOUNTER — Ambulatory Visit (HOSPITAL_COMMUNITY)
Admission: RE | Admit: 2013-09-29 | Discharge: 2013-09-29 | Disposition: A | Discharge status: Home / Self Care | Payer: 59 | Source: Ambulatory Visit | Attending: Family Medicine | Admitting: Family Medicine

## 2013-09-29 DIAGNOSIS — R51 Headache: Secondary | ICD-10-CM | POA: Insufficient documentation

## 2013-09-29 DIAGNOSIS — R42 Dizziness and giddiness: Secondary | ICD-10-CM

## 2013-09-29 DIAGNOSIS — M542 Cervicalgia: Secondary | ICD-10-CM | POA: Insufficient documentation

## 2013-09-29 DIAGNOSIS — R519 Headache, unspecified: Secondary | ICD-10-CM

## 2013-12-15 ENCOUNTER — Encounter: Payer: Self-pay | Admitting: Internal Medicine

## 2014-01-01 ENCOUNTER — Other Ambulatory Visit: Payer: Self-pay

## 2014-01-01 DIAGNOSIS — Z1231 Encounter for screening mammogram for malignant neoplasm of breast: Secondary | ICD-10-CM

## 2014-02-06 ENCOUNTER — Ambulatory Visit (INDEPENDENT_AMBULATORY_CARE_PROVIDER_SITE_OTHER): Payer: 59 | Admitting: Urology

## 2014-02-06 DIAGNOSIS — N952 Postmenopausal atrophic vaginitis: Secondary | ICD-10-CM

## 2014-02-09 ENCOUNTER — Ambulatory Visit
Admission: RE | Admit: 2014-02-09 | Discharge: 2014-02-09 | Disposition: A | Discharge status: Home / Self Care | Payer: 59 | Source: Ambulatory Visit

## 2014-02-09 DIAGNOSIS — Z1231 Encounter for screening mammogram for malignant neoplasm of breast: Secondary | ICD-10-CM

## 2014-02-26 ENCOUNTER — Telehealth: Payer: Self-pay | Admitting: Internal Medicine

## 2014-02-26 NOTE — Telephone Encounter (Signed)
Pt states she had a diverticulitis flare this weekend and she started taking the meds that Dr. Henrene Pastor had prescribed for her if this happened on a weekend. States she has enough antibiotics for 10 days. Instructed pt to call us back if she was no better after taking the antibiotics. Pt verbalized understanding.

## 2014-04-08 ENCOUNTER — Emergency Department (HOSPITAL_COMMUNITY)
Admission: EM | Admit: 2014-04-08 | Discharge: 2014-04-08 | Disposition: A | Discharge status: Home / Self Care | Payer: PRIVATE HEALTH INSURANCE | Attending: Emergency Medicine | Admitting: Emergency Medicine

## 2014-04-08 ENCOUNTER — Encounter (HOSPITAL_COMMUNITY): Payer: Self-pay

## 2014-04-08 DIAGNOSIS — Y9389 Activity, other specified: Secondary | ICD-10-CM | POA: Insufficient documentation

## 2014-04-08 DIAGNOSIS — S6991XA Unspecified injury of right wrist, hand and finger(s), initial encounter: Secondary | ICD-10-CM | POA: Insufficient documentation

## 2014-04-08 DIAGNOSIS — Z86010 Personal history of colonic polyps: Secondary | ICD-10-CM | POA: Diagnosis not present

## 2014-04-08 DIAGNOSIS — Y9289 Other specified places as the place of occurrence of the external cause: Secondary | ICD-10-CM | POA: Insufficient documentation

## 2014-04-08 DIAGNOSIS — Z792 Long term (current) use of antibiotics: Secondary | ICD-10-CM | POA: Diagnosis not present

## 2014-04-08 DIAGNOSIS — Y990 Civilian activity done for income or pay: Secondary | ICD-10-CM | POA: Insufficient documentation

## 2014-04-08 DIAGNOSIS — Z7982 Long term (current) use of aspirin: Secondary | ICD-10-CM | POA: Diagnosis not present

## 2014-04-08 DIAGNOSIS — M778 Other enthesopathies, not elsewhere classified: Secondary | ICD-10-CM

## 2014-04-08 DIAGNOSIS — W231XXA Caught, crushed, jammed, or pinched between stationary objects, initial encounter: Secondary | ICD-10-CM | POA: Insufficient documentation

## 2014-04-08 DIAGNOSIS — I10 Essential (primary) hypertension: Secondary | ICD-10-CM | POA: Diagnosis not present

## 2014-04-08 DIAGNOSIS — Z79899 Other long term (current) drug therapy: Secondary | ICD-10-CM | POA: Diagnosis not present

## 2014-04-08 DIAGNOSIS — K219 Gastro-esophageal reflux disease without esophagitis: Secondary | ICD-10-CM | POA: Diagnosis not present

## 2014-04-08 DIAGNOSIS — M779 Enthesopathy, unspecified: Secondary | ICD-10-CM

## 2014-04-08 MED ORDER — HYDROCODONE-ACETAMINOPHEN 5-325 MG PO TABS: 1.0000 | ORAL_TABLET | ORAL | Status: DC | PRN

## 2014-04-08 MED ORDER — KETOROLAC TROMETHAMINE 10 MG PO TABS
10.0000 mg | ORAL_TABLET | Freq: Once | ORAL | Status: AC
  Administered 2014-04-08: 10 mg via ORAL
  Filled 2014-04-08: qty 1

## 2014-04-08 MED ORDER — ONDANSETRON 4 MG PO TBDP: 4.0000 mg | ORAL_TABLET | Freq: Four times a day (QID) | ORAL | Status: DC | PRN

## 2014-04-08 MED ORDER — CELECOXIB 100 MG PO CAPS: 100.0000 mg | ORAL_CAPSULE | Freq: Two times a day (BID) | ORAL | Status: DC

## 2014-04-08 MED ORDER — DEXAMETHASONE SODIUM PHOSPHATE 4 MG/ML IJ SOLN
8.0000 mg | Freq: Once | INTRAMUSCULAR | Status: AC
  Administered 2014-04-08: 8 mg via INTRAMUSCULAR
  Filled 2014-04-08: qty 2

## 2014-04-08 NOTE — ED Provider Notes (Signed)
CSN: [id number]     Arrival date & time 04/08/14  1941 History   First MD Initiated Contact with Patient 04/08/14 2005     Chief Complaint  Patient presents with  . Hand Pain     (Consider location/radiation/quality/duration/timing/severity/associated sxs/prior Treatment) HPI Comments: Patient is a 63 year old female who presents to the emergency department with a complaint of right hand pain. 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 She noticed extensive pain involving the index and long finger and the area behind the index and long finger. She states that she feels a "grinding sensation" in this area. Prior to her getting her hand caught between the door handle she has not had any previous injury to the right hand. She has not taken any medication for this problem. Movement makes the problem worse.  Patient is a 63 y.o. female presenting with hand pain. The history is provided by the patient.  Hand Pain This is a new problem. Associated symptoms include arthralgias. Pertinent negatives include no abdominal pain, chest pain, coughing or neck pain.    Past Medical History  Diagnosis Date  . Hypertension   . GERD (gastroesophageal reflux disease)   . Diverticulosis   . Hemorrhoids   . Esophageal stricture   . Hiatal hernia   . Hyperplastic colon polyp   . Diverticulitis    Past Surgical History  Procedure Laterality Date  . Tonsillectomy    . Shoulder spur surgery Right   . Dental implants    . Knee arthroscopy w/ meniscal repair Left   . Ankle surgery Left   . Abdominal hysterectomy     Family History  Problem Relation Age of Onset  . Breast cancer Mother   . Stomach cancer Father   . Diabetes Sister     twin  . Diabetes Brother   . Diabetes Father   . Heart disease Father    History  Substance Use Topics  . Smoking status: Never Smoker    . Smokeless tobacco: Never Used  . Alcohol Use: No   OB History    No data available     Review of Systems  Constitutional: Negative for activity change.       All ROS Neg except as noted in HPI  HENT: Negative for nosebleeds.   Eyes: Negative for photophobia and discharge.  Respiratory: Negative for cough, shortness of breath and wheezing.   Cardiovascular: Negative for chest pain and palpitations.  Gastrointestinal: Negative for abdominal pain and blood in stool.  Genitourinary: Negative for dysuria, frequency and hematuria.  Musculoskeletal: Positive for arthralgias. Negative for back pain and neck pain.  Skin: Negative.   Neurological: Negative for dizziness, seizures and speech difficulty.  Psychiatric/Behavioral: Negative for hallucinations and confusion.      Allergies  Acetaminophen-codeine; Azithromycin; Doxycycline; Oxycodone-acetaminophen; and Tramadol  Home Medications   Prior to Admission medications   Medication Sig Start Date End Date Taking? Authorizing Provider  AMBULATORY NON FORMULARY MEDICATION Medication Name: Kitamin K 40 mg/D3 5000 IU Take 1 tablet by mouth once daily    Historical Provider, MD  amoxicillin-clavulanate (AUGMENTIN) 875-125 MG per tablet Take 1 tablet by mouth 2 (two) times daily. 05/26/13   Irene Shipper, MD  aspirin 81 MG tablet Take 81 mg by mouth daily.    Historical Provider, MD  Flaxseed, Linseed, (FLAXSEED OIL PO) Take by mouth.  2400 mg/omega 3 Take 1 capsule once daily    Historical Provider, MD  hydrochlorothiazide (HYDRODIURIL) 25 MG tablet Take 25 mg by mouth every other day.    Historical Provider, MD  lisinopril (PRINIVIL,ZESTRIL) 10 MG tablet Take 10 mg by mouth every other day.    Historical Provider, MD  metroNIDAZOLE (FLAGYL) 250 MG tablet Take 1 tablet (250 mg total) by mouth 4 (four) times daily. 06/06/13   Irene Shipper, MD  omeprazole (PRILOSEC OTC) 20 MG tablet Take 20 mg by mouth daily.    Historical Provider, MD   ondansetron (ZOFRAN ODT) 4 MG disintegrating tablet Take 1 tablet (4 mg total) by mouth every 8 (eight) hours as needed. 03/22/13   Fredia Sorrow, MD  oxybutynin (DITROPAN-XL) 10 MG 24 hr tablet Take 10 mg by mouth at bedtime.    Historical Provider, MD  promethazine (PHENERGAN) 25 MG tablet Take 1 tablet (25 mg total) by mouth every 6 (six) hours as needed for nausea or vomiting. 03/22/13   Fredia Sorrow, MD  traMADol (ULTRAM) 50 MG tablet Take 1 tablet (50 mg total) by mouth every 6 (six) hours as needed. 03/24/13   Willia Craze, NP  triamcinolone cream (KENALOG) 0.1 % Apply 1 application topically as needed (for skin irritation).     Historical Provider, MD   BP 152/87 mmHg  Pulse 87  Temp(Src) 98 F (36.7 C) (Oral)  Resp 20  Ht 4' 11.5" (1.511 m)  Wt 133 lb (60.328 kg)  BMI 26.42 kg/m2  SpO2 98% Physical Exam  Constitutional: She is oriented to person, place, and time. She appears well-developed and well-nourished.  Non-toxic appearance.  HENT:  Head: Normocephalic.  Right Ear: Tympanic membrane and external ear normal.  Left Ear: Tympanic membrane and external ear normal.  Eyes: EOM and lids are normal. Pupils are equal, round, and reactive to light.  Neck: Normal range of motion. Neck supple. Carotid bruit is not present.  Cardiovascular: Normal rate, regular rhythm, normal heart sounds, intact distal pulses and normal pulses.   Pulmonary/Chest: Breath sounds normal. No respiratory distress.  Abdominal: Soft. Bowel sounds are normal. There is no tenderness. There is no guarding.  Musculoskeletal: Normal range of motion.  Capillary refill is less than 2 seconds of the upper extremity. Radial pulses are 2+. There are degenerative joint disease changes of the right and left hand. There is crepitus with movement of the index finger and long finger just below the MP joints,. There is pain to palpation of the dorsum of the index finger, long finger, and dorsum of the hand.   Lymphadenopathy:       Head (right side): No submandibular adenopathy present.       Head (left side): No submandibular adenopathy present.    She has no cervical adenopathy.  Neurological: She is alert and oriented to person, place, and time. She has normal strength. No cranial nerve deficit or sensory deficit.  Skin: Skin is warm and dry.  Psychiatric: She has a normal mood and affect. Her speech is normal.  Nursing note and vitals reviewed.   ED Course  Procedures (including critical care time) Labs Review Labs Reviewed - No data to display  Imaging Review No results found.   EKG Interpretation None      MDM  Vital signs are well within normal limits. Pulse oximetry is 98% on room air.  The examination is consistent with tendinitis involving the right hand. The patient will be treated with Celebrex and  Norco. Patient is placed in a splint to use for the next 5 days.    Final diagnoses:  None    **I have reviewed nursing notes, vital signs, and all appropriate lab and imaging results for this patient.Lenox Ahr, PA-C 04/08/14 2027  Veryl Speak, MD 04/08/14 [phone]

## 2014-04-08 NOTE — Discharge Instructions (Signed)
Please use the splint for the next 5 to 7 days. Use celebrex two times daily with food. Use norco for pain if needed. This medication may cause drowsiness, please do not drive, drink alcohol, operate machinery, or handle legal documents while taking this medication. Please see Dr. Aline Brochure for additional evaluation, if not improving. Tendinitis  Tendinitis is redness, soreness, and puffiness (inflammation) of the tendons. Tendons are band-like tissues that connect muscle to bone. Tendinitis often happens in the shoulders, heels, or elbows. It might happen if your job involves doing the same motions over and over. HOME CARE  Use a sling or splint as told by your doctor.  Put ice on the injured area.  Put ice in a plastic bag.  Place a towel between your skin and the bag.  Leave the ice on for 15-20 minutes, 03-04 times a day.  Avoid using your injured arm or leg until the pain goes away.  Do gentle exercises only as told by your doctor. Stop exercises if the pain gets worse, unless your doctor tells you otherwise.  Only take medicines as told by your doctor. GET HELP RIGHT AWAY IF:  Your pain and puffiness get worse.  You have new problems, such as loss of feeling (numbness) in the hands. MAKE SURE YOU:  Understand these instructions.  Will watch your condition.  Will get help right away if you are not doing well or get worse. Document Released: 07/10/2010 Document Revised: 06/22/2011 Document Reviewed: 07/10/2010 [facility] Patient Information 2015 [place], [place]. This information is not intended to replace advice given to you by your health care provider. Make sure you discuss any questions you have with your health care provider.

## 2014-04-08 NOTE — ED Notes (Signed)
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 Pt states she has already filled out workers compensation paper. No Hx of arthritis in right hand.

## 2014-06-19 ENCOUNTER — Other Ambulatory Visit (HOSPITAL_COMMUNITY): Payer: Self-pay | Admitting: Physician Assistant

## 2014-06-19 ENCOUNTER — Ambulatory Visit (HOSPITAL_COMMUNITY)
Admission: RE | Admit: 2014-06-19 | Discharge: 2014-06-19 | Disposition: A | Discharge status: Home / Self Care | Payer: 59 | Source: Ambulatory Visit | Attending: Physician Assistant | Admitting: Physician Assistant

## 2014-06-19 DIAGNOSIS — R059 Cough, unspecified: Secondary | ICD-10-CM

## 2014-06-19 DIAGNOSIS — R05 Cough: Secondary | ICD-10-CM

## 2014-06-19 DIAGNOSIS — J209 Acute bronchitis, unspecified: Secondary | ICD-10-CM

## 2014-06-19 DIAGNOSIS — R079 Chest pain, unspecified: Secondary | ICD-10-CM | POA: Diagnosis not present

## 2014-06-19 DIAGNOSIS — R0602 Shortness of breath: Secondary | ICD-10-CM | POA: Diagnosis not present

## 2014-06-26 ENCOUNTER — Telehealth: Payer: Self-pay | Admitting: Nurse Practitioner

## 2014-06-26 DIAGNOSIS — R059 Cough, unspecified: Secondary | ICD-10-CM

## 2014-06-26 DIAGNOSIS — R05 Cough: Secondary | ICD-10-CM

## 2014-06-26 MED ORDER — AZITHROMYCIN 250 MG PO TABS: ORAL_TABLET | ORAL | Status: DC

## 2014-06-26 MED ORDER — AMOXICILLIN-POT CLAVULANATE 875-125 MG PO TABS: 1.0000 | ORAL_TABLET | Freq: Two times a day (BID) | ORAL | Status: DC

## 2014-06-26 MED ORDER — BENZONATATE 100 MG PO CAPS: 100.0000 mg | ORAL_CAPSULE | Freq: Three times a day (TID) | ORAL | Status: DC | PRN

## 2014-06-26 NOTE — Progress Notes (Signed)
We are sorry that you are not feeling well.  Here is how we plan to help!  Based on what you have shared with me it looks like you have upper respiratory tract inflammation that has resulted in a significant cough.  Inflammation and infection in the upper respiratory tract is commonly called bronchitis and has four common causes:  Allergies, Viral Infections, Acid Reflux and Bacterial Infections.  Allergies, viruses and acid reflux are treated by controlling symptoms or eliminating the cause. An example might be a cough caused by taking certain blood pressure medications. You stop the cough by changing the medication. Another example might be a cough caused by acid reflux. Controlling the reflux helps control the cough.  Based on your presentation I believe you most likely have A cough due to bacteria.  When patients have a fever and a productive cough with a change in color or increased sputum production, we are concerned about bacterial bronchitis.  If left untreated it can progress to pneumonia.  If your symptoms do not improve with your treatment plan it is important that you contact your provider.   augmentin since you are allergic to zithromax   In addition you may use A prescription cough medication called Tessalon Perles 100mg . You may take 1-2 capsules every 8 hours as needed for your cough.    HOME CARE . Only take medications as instructed by your medical team. . Complete the entire course of an antibiotic. . Drink plenty of fluids and get plenty of rest. . Avoid close contacts especially the very young and the elderly . Cover your mouth if you cough or cough into your sleeve. . Always remember to wash your hands . A steam or ultrasonic humidifier can help congestion.    GET HELP RIGHT AWAY IF: . You develop worsening fever. . You become short of breath . You cough up blood. . Your symptoms persist after you have completed your treatment plan MAKE SURE YOU   Understand these  instructions.  Will watch your condition.  Will get help right away if you are not doing well or get worse.  Your e-visit answers were reviewed by a board certified advanced clinical practitioner to complete your personal care plan.  Depending on the condition, your plan could have included both over the counter or prescription medications.  If there is a problem please reply  once you have received a response from your provider.  Your safety is important to [place].  If you have drug allergies check your prescription carefully.    You can use MyChart to ask questions about today's visit, request a non-urgent call back, or ask for a work or school excuse.  You will get an e-mail in the next two days asking about your experience.  I hope that your e-visit has been valuable and will speed your recovery. Thank you for using e-visits.

## 2014-08-26 ENCOUNTER — Encounter (HOSPITAL_COMMUNITY): Payer: Self-pay | Admitting: *Deleted

## 2014-08-26 ENCOUNTER — Emergency Department (HOSPITAL_COMMUNITY): Payer: 59

## 2014-08-26 ENCOUNTER — Emergency Department (HOSPITAL_COMMUNITY)
Admission: EM | Admit: 2014-08-26 | Discharge: 2014-08-27 | Disposition: A | Discharge status: Home / Self Care | Payer: 59 | Attending: Emergency Medicine | Admitting: Emergency Medicine

## 2014-08-26 DIAGNOSIS — Y92410 Unspecified street and highway as the place of occurrence of the external cause: Secondary | ICD-10-CM | POA: Diagnosis not present

## 2014-08-26 DIAGNOSIS — S3992XA Unspecified injury of lower back, initial encounter: Secondary | ICD-10-CM | POA: Insufficient documentation

## 2014-08-26 DIAGNOSIS — S8991XA Unspecified injury of right lower leg, initial encounter: Secondary | ICD-10-CM | POA: Insufficient documentation

## 2014-08-26 DIAGNOSIS — S299XXA Unspecified injury of thorax, initial encounter: Secondary | ICD-10-CM | POA: Diagnosis not present

## 2014-08-26 DIAGNOSIS — Z7982 Long term (current) use of aspirin: Secondary | ICD-10-CM | POA: Insufficient documentation

## 2014-08-26 DIAGNOSIS — S199XXA Unspecified injury of neck, initial encounter: Secondary | ICD-10-CM | POA: Insufficient documentation

## 2014-08-26 DIAGNOSIS — I10 Essential (primary) hypertension: Secondary | ICD-10-CM | POA: Diagnosis not present

## 2014-08-26 DIAGNOSIS — T148 Other injury of unspecified body region: Secondary | ICD-10-CM | POA: Diagnosis not present

## 2014-08-26 DIAGNOSIS — T148XXA Other injury of unspecified body region, initial encounter: Secondary | ICD-10-CM

## 2014-08-26 DIAGNOSIS — S0990XA Unspecified injury of head, initial encounter: Secondary | ICD-10-CM | POA: Diagnosis present

## 2014-08-26 DIAGNOSIS — S8992XA Unspecified injury of left lower leg, initial encounter: Secondary | ICD-10-CM | POA: Diagnosis not present

## 2014-08-26 DIAGNOSIS — Y998 Other external cause status: Secondary | ICD-10-CM | POA: Diagnosis not present

## 2014-08-26 DIAGNOSIS — Y9389 Activity, other specified: Secondary | ICD-10-CM | POA: Diagnosis not present

## 2014-08-26 DIAGNOSIS — K219 Gastro-esophageal reflux disease without esophagitis: Secondary | ICD-10-CM | POA: Insufficient documentation

## 2014-08-26 DIAGNOSIS — Z79899 Other long term (current) drug therapy: Secondary | ICD-10-CM | POA: Insufficient documentation

## 2014-08-26 DIAGNOSIS — Z86010 Personal history of colonic polyps: Secondary | ICD-10-CM | POA: Insufficient documentation

## 2014-08-26 MED ORDER — ACETAMINOPHEN 325 MG PO TABS
650.0000 mg | ORAL_TABLET | Freq: Once | ORAL | Status: AC
  Administered 2014-08-26: 650 mg via ORAL
  Filled 2014-08-26: qty 2

## 2014-08-26 MED ORDER — FENTANYL CITRATE (PF) 100 MCG/2ML IJ SOLN: 50.0000 ug | Freq: Once | INTRAMUSCULAR | Status: DC
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
## 2014-08-26 NOTE — ED Notes (Signed)
Pt is c/o lower back pain, upper back and neck pain, headache and right calf muscle pain; pt removed from [place] and pt's husband is in room with her waiting to be seen

## 2014-08-26 NOTE — Discharge Instructions (Signed)

## 2014-08-30 ENCOUNTER — Emergency Department (HOSPITAL_COMMUNITY)
Admission: EM | Admit: 2014-08-30 | Discharge: 2014-08-30 | Disposition: A | Discharge status: Home / Self Care | Payer: 59 | Attending: Emergency Medicine | Admitting: Emergency Medicine

## 2014-08-30 ENCOUNTER — Encounter (HOSPITAL_COMMUNITY): Payer: Self-pay

## 2014-08-30 DIAGNOSIS — I10 Essential (primary) hypertension: Secondary | ICD-10-CM | POA: Diagnosis not present

## 2014-08-30 DIAGNOSIS — Z7982 Long term (current) use of aspirin: Secondary | ICD-10-CM | POA: Insufficient documentation

## 2014-08-30 DIAGNOSIS — Z79899 Other long term (current) drug therapy: Secondary | ICD-10-CM | POA: Insufficient documentation

## 2014-08-30 DIAGNOSIS — R51 Headache: Secondary | ICD-10-CM | POA: Diagnosis present

## 2014-08-30 DIAGNOSIS — F419 Anxiety disorder, unspecified: Secondary | ICD-10-CM | POA: Insufficient documentation

## 2014-08-30 DIAGNOSIS — Z791 Long term (current) use of non-steroidal anti-inflammatories (NSAID): Secondary | ICD-10-CM | POA: Diagnosis not present

## 2014-08-30 DIAGNOSIS — Z86010 Personal history of colonic polyps: Secondary | ICD-10-CM | POA: Insufficient documentation

## 2014-08-30 DIAGNOSIS — G44319 Acute post-traumatic headache, not intractable: Secondary | ICD-10-CM | POA: Diagnosis not present

## 2014-08-30 DIAGNOSIS — K219 Gastro-esophageal reflux disease without esophagitis: Secondary | ICD-10-CM | POA: Insufficient documentation

## 2014-08-30 MED ORDER — METHOCARBAMOL 500 MG PO TABS: 500.0000 mg | ORAL_TABLET | Freq: Three times a day (TID) | ORAL | Status: DC

## 2014-08-30 MED ORDER — METOCLOPRAMIDE HCL 5 MG/ML IJ SOLN
10.0000 mg | Freq: Once | INTRAMUSCULAR | Status: AC
  Administered 2014-08-30: 10 mg via INTRAVENOUS
  Filled 2014-08-30: qty 2

## 2014-08-30 MED ORDER — DIPHENHYDRAMINE HCL 50 MG/ML IJ SOLN
12.5000 mg | Freq: Once | INTRAMUSCULAR | Status: AC
  Administered 2014-08-30: 12.5 mg via INTRAVENOUS
  Filled 2014-08-30: qty 1

## 2014-08-30 MED ORDER — KETOROLAC TROMETHAMINE 30 MG/ML IJ SOLN
30.0000 mg | Freq: Once | INTRAMUSCULAR | Status: AC
  Administered 2014-08-30: 30 mg via INTRAVENOUS
  Filled 2014-08-30: qty 1

## 2014-08-30 NOTE — Discharge Instructions (Signed)
Post-Concussion Syndrome Post-concussion syndrome means you have problems after a head injury. The problems can last for weeks or months. The problems usually go away on their own over time. HOME CARE   Only take medicines as told by your doctor. Do not take aspirin.  Sleep with your head raised (elevated) to help with headaches.  Avoid activities that can cause another head injury.  Do not play contact sports like football, hockey, soccer or basketball. Do not do other risky activities like downhill skiing or martial arts, or ride horses until your doctor says it is OK.  Keep all doctor visits as told. GET HELP RIGHT AWAY IF:  You feel confused or very sleepy.  You cannot wake the injured person.  You feel sick to your stomach (nauseous) or keep throwing up (vomiting).  You feel like you are moving when you are not (vertigo).  You notice the injured person's eyes moving back and forth very fast.  You start shaking (convulsing) or pass out (faint).  You have very bad headaches that do not get better with medicine.  You cannot use your arms or legs normally.  The black centers of your eyes (pupils) change size.  You have clear or bloody fluid coming from your nose or ears.  Your problems get worse, not better. MAKE SURE YOU:  Understand these instructions.  Will watch your condition.  Will get help right away if you are not doing well or get worse. Document Released: 05/07/2004 Document Revised: 01/18/2013 Document Reviewed: 07/05/2013 [facility] Patient Information 2015 [place], [place]. This information is not intended to replace advice given to you by your health care provider. Make sure you discuss any questions you have with your health care provider.
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
## 2014-08-30 NOTE — ED Notes (Signed)
Patient with no complaints at this time. Respirations even and unlabored. Skin warm/dry. Discharge instructions reviewed with patient at this time. Patient given opportunity to voice concerns/ask questions. IV removed per policy and band-aid applied to site. Patient discharged at this time and left Emergency Department with steady gait.  

## 2014-09-04 ENCOUNTER — Emergency Department (HOSPITAL_COMMUNITY)
Admission: EM | Admit: 2014-09-04 | Discharge: 2014-09-04 | Disposition: A | Discharge status: Home / Self Care | Payer: 59 | Attending: Emergency Medicine | Admitting: Emergency Medicine

## 2014-09-04 ENCOUNTER — Emergency Department (HOSPITAL_COMMUNITY): Payer: 59

## 2014-09-04 ENCOUNTER — Encounter (HOSPITAL_COMMUNITY): Payer: Self-pay | Admitting: Cardiology

## 2014-09-04 DIAGNOSIS — F0781 Postconcussional syndrome: Secondary | ICD-10-CM | POA: Insufficient documentation

## 2014-09-04 DIAGNOSIS — R51 Headache: Secondary | ICD-10-CM | POA: Diagnosis not present

## 2014-09-04 DIAGNOSIS — Z79899 Other long term (current) drug therapy: Secondary | ICD-10-CM | POA: Insufficient documentation

## 2014-09-04 DIAGNOSIS — Z87828 Personal history of other (healed) physical injury and trauma: Secondary | ICD-10-CM | POA: Diagnosis not present

## 2014-09-04 DIAGNOSIS — Z7982 Long term (current) use of aspirin: Secondary | ICD-10-CM | POA: Insufficient documentation

## 2014-09-04 DIAGNOSIS — Z86010 Personal history of colonic polyps: Secondary | ICD-10-CM | POA: Insufficient documentation

## 2014-09-04 DIAGNOSIS — K219 Gastro-esophageal reflux disease without esophagitis: Secondary | ICD-10-CM | POA: Diagnosis not present

## 2014-09-04 DIAGNOSIS — Z791 Long term (current) use of non-steroidal anti-inflammatories (NSAID): Secondary | ICD-10-CM | POA: Diagnosis not present

## 2014-09-04 DIAGNOSIS — I10 Essential (primary) hypertension: Secondary | ICD-10-CM | POA: Insufficient documentation

## 2014-09-04 DIAGNOSIS — G44309 Post-traumatic headache, unspecified, not intractable: Secondary | ICD-10-CM

## 2014-09-04 DIAGNOSIS — Z792 Long term (current) use of antibiotics: Secondary | ICD-10-CM | POA: Diagnosis not present

## 2014-09-04 MED ORDER — DIPHENHYDRAMINE HCL 50 MG/ML IJ SOLN
25.0000 mg | Freq: Once | INTRAMUSCULAR | Status: AC
  Administered 2014-09-04: 25 mg via INTRAVENOUS
  Filled 2014-09-04: qty 1

## 2014-09-04 MED ORDER — METOCLOPRAMIDE HCL 5 MG/ML IJ SOLN
10.0000 mg | Freq: Once | INTRAMUSCULAR | Status: AC
  Administered 2014-09-04: 10 mg via INTRAVENOUS
  Filled 2014-09-04: qty 2

## 2014-09-04 MED ORDER — METOCLOPRAMIDE HCL 10 MG PO TABS: 10.0000 mg | ORAL_TABLET | Freq: Four times a day (QID) | ORAL | Status: DC | PRN
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
## 2014-09-04 NOTE — ED Notes (Signed)
Patient with no complaints at this time. Respirations even and unlabored. Skin warm/dry. Discharge instructions reviewed with patient at this time. Patient given opportunity to voice concerns/ask questions. IV removed per policy and band-aid applied to site. Patient discharged at this time and left Emergency Department with steady gait.  

## 2014-09-04 NOTE — ED Notes (Signed)
Prior to arrival patient took 1000 mg Tylenol.
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
## 2014-10-10 ENCOUNTER — Ambulatory Visit (HOSPITAL_COMMUNITY): Payer: 59 | Attending: Sports Medicine | Admitting: Physical Therapy

## 2014-10-10 DIAGNOSIS — R293 Abnormal posture: Secondary | ICD-10-CM | POA: Diagnosis not present

## 2014-10-10 DIAGNOSIS — M542 Cervicalgia: Secondary | ICD-10-CM | POA: Diagnosis present

## 2014-10-10 DIAGNOSIS — M62838 Other muscle spasm: Secondary | ICD-10-CM

## 2014-10-10 DIAGNOSIS — M6248 Contracture of muscle, other site: Secondary | ICD-10-CM | POA: Diagnosis not present

## 2014-10-10 DIAGNOSIS — R6884 Jaw pain: Secondary | ICD-10-CM | POA: Diagnosis not present

## 2014-10-10 DIAGNOSIS — R29898 Other symptoms and signs involving the musculoskeletal system: Secondary | ICD-10-CM | POA: Diagnosis not present

## 2014-10-10 NOTE — Patient Instructions (Signed)
Axial Extension (Chin Tuck)   Pull chin in and lengthen back of neck. Hold _3_ seconds while counting out loud. Repeat _10___ times. Do _1-2___ sessions per day.  http://gt2.exer.us/450   Copyright  VHI. All rights reserved.  Upper Cervical Rotation   Rotate head slowly from side to side as if saying "no". Do not turn head completely to either side. Keep motion small. Repeat 10____ times per set. Do __1__ sets per session. Do _1-2___ sessions per day.  http://orth.exer.us/374   Copyright  VHI. All rights reserved.
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
## 2014-10-11 ENCOUNTER — Ambulatory Visit (INDEPENDENT_AMBULATORY_CARE_PROVIDER_SITE_OTHER): Payer: 59 | Admitting: Neurology

## 2014-10-11 ENCOUNTER — Encounter: Payer: Self-pay | Admitting: Neurology

## 2014-10-11 VITALS — BP 139/89 | HR 73 | Ht 59.0 in | Wt 137.8 lb

## 2014-10-11 DIAGNOSIS — R51 Headache: Secondary | ICD-10-CM

## 2014-10-11 DIAGNOSIS — S161XXA Strain of muscle, fascia and tendon at neck level, initial encounter: Secondary | ICD-10-CM | POA: Diagnosis not present

## 2014-10-11 DIAGNOSIS — G4486 Cervicogenic headache: Secondary | ICD-10-CM

## 2014-10-11 HISTORY — DX: Strain of muscle, fascia and tendon at neck level, initial encounter: S16.1XXA

## 2014-10-11 HISTORY — DX: Cervicogenic headache: G44.86
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
## 2014-10-11 NOTE — Patient Instructions (Signed)

## 2014-10-12 ENCOUNTER — Ambulatory Visit (HOSPITAL_COMMUNITY): Payer: 59 | Attending: Sports Medicine

## 2014-10-12 DIAGNOSIS — M6248 Contracture of muscle, other site: Secondary | ICD-10-CM | POA: Insufficient documentation

## 2014-10-12 DIAGNOSIS — M542 Cervicalgia: Secondary | ICD-10-CM | POA: Diagnosis not present

## 2014-10-12 DIAGNOSIS — R29898 Other symptoms and signs involving the musculoskeletal system: Secondary | ICD-10-CM | POA: Diagnosis present

## 2014-10-12 DIAGNOSIS — M62838 Other muscle spasm: Secondary | ICD-10-CM

## 2014-10-12 DIAGNOSIS — R293 Abnormal posture: Secondary | ICD-10-CM | POA: Insufficient documentation

## 2014-10-12 DIAGNOSIS — R6884 Jaw pain: Secondary | ICD-10-CM | POA: Diagnosis present

## 2014-10-12 NOTE — Therapy (Signed)
[place], [place], [address] Phone: [phone]   Fax:  [phone]  Physical Therapy Treatment  Patient Details  Name: Shelley Petersen MRN: [id number] Date of Birth: January 20, 1951 Referring Provider:  Verner Chol, MD  Encounter Date: 10/12/2014      PT End of Session - 10/12/14 1529    Visit Number 2   Number of Visits 12   Date for PT Re-Evaluation 11/09/14   Authorization Type [employer]   PT Start Time 1525   PT Stop Time 1603   PT Time Calculation (min) 38 min   Activity Tolerance Patient tolerated treatment well   Behavior During Therapy [facility] for tasks assessed/performed      Past Medical History  Diagnosis Date  . Hypertension   . GERD (gastroesophageal reflux disease)   . Diverticulosis   . Hemorrhoids   . Esophageal stricture   . Hiatal hernia   . Hyperplastic colon polyp   . Diverticulitis   . Headache   . Neck pain   . Cervical strain 10/11/2014  . Cervicogenic headache 10/11/2014    Past Surgical History  Procedure Laterality Date  . Tonsillectomy    . Shoulder spur surgery Right   . Dental implants    . Knee arthroscopy w/ meniscal repair Left   . Ankle surgery Left   . Abdominal hysterectomy      There were no vitals filed for this visit.  Visit Diagnosis:  Cervicalgia  Decreased ROM of neck  Muscle spasms of head and/or neck  Jaw pain, non-TMJ  Abnormal posture      Subjective Assessment - 10/12/14 1525    Subjective Pt reports compliance with HEP, stated lightheaded during exercises.  Current pain scale 3/10 Lt posterior neck and headache today.     Pain Score 3    Pain Location Head   Pain Orientation Left;Posterior               [facility] Adult PT Treatment/Exercise - 10/12/14 0001    Exercises   Exercises Neck   Neck Exercises: Seated   Neck Retraction 10 reps;3 secs   Neck Retraction Limitations chin tuck   Cervical Rotation Both;10 reps    Cervical Rotation Limitations 3D cervical excursion   Lateral Flexion Both;10 reps   Lateral Flexion Limitations 3D cervical excursion   Other Seated Exercise scapular retraction 10x   Manual Therapy   Manual Therapy Soft tissue mobilization   Soft tissue mobilization Bil UT, levator scapula, SCM, scalenes and fascial with emphasis on TMJ   Neck Exercises: Stretches   Upper Trapezius Stretch 3 reps;20 seconds   Upper Trapezius Stretch Limitations seated lateral sidebend                  PT Short Term Goals - 10/12/14 1530    PT SHORT TERM GOAL #1   Title Pt will be independent with HEP   Status On-going   PT SHORT TERM GOAL #2   Title Improve cervical ROM by 10 degrees for extension, left sidebending, and bilateral rotation to improve mobility.    Status On-going   PT SHORT TERM GOAL #3   Title Pt will work for 3 hours without pain in her neck or head.           PT Long Term Goals - 10/12/14 1531    PT LONG TERM GOAL #1   Title Pt will be independent with advanced HEP.  PT LONG TERM GOAL #2   Title Improve cervical ROM by 15 degrees for right sidebending and bilateral rotation to allow for increased mobility.   PT LONG TERM GOAL #3   Title Decrease muscle spasm in bilateral upper traps, SCM, and levator scap to decrease headaches and jaw pain.    Status On-going   PT LONG TERM GOAL #4   Title Pt will work 8 hours without increased pain in head or neck.                Plan - 10/12/14 1604    Clinical Impression Statement Reviewed goals, compliance with HEP and copy of eval given to pt.  Pt able to demonstrate appropriate technique with chin tucks with no cueing required.  Session focus on improve cervical ROM and cervical/scapular stabilization with min cueing required for proper form and technique.  Ended session with manual cervical massage to reduce spasms in Bil UT, scalenes, SCM, levator scapula and TMJ.  Pt with increased tenderness Lt suboccipital   lobes, Lt>Rt  TMJ, Lt scalenes and temporal lobes.  Pt encouraged to stay hydrated to assist with headaches and educated diaphragmatic breathing to assist with stress.   PT Next Visit Plan Continue with current PT POC for cervical and scapular stabilization with cuff, next session give pt TMJ exercises (gentle massage to temporalis and masseter mm; posture instructions; relaxing position of the jaw; chin tucks, fist under jaw try to open mouth with gentle resistance on fist, isometric midline side to side, tongue to roof of moth then open moth, protude; jaw stretch).        Problem List Patient Active Problem List   Diagnosis Date Noted  . Cervical strain 10/11/2014  . Cervicogenic headache 10/11/2014  . Diarrhea 03/24/2013  . LLQ abdominal pain 12/26/2012  . Hx of diverticulitis of colon 12/26/2012  . DIVERTICULITIS-COLON 06/09/2010  . GERD 10/07/2007  . IRRITABLE BOWEL SYNDROME 10/07/2007  . HYPERTENSION 04/30/2007  . INTERNAL HEMORRHOIDS 04/30/2007  . HEMORRHOIDS, EXTERNAL 04/30/2007  . DIVERTICULAR DISEASE 04/30/2007  . RECTAL BLEEDING 04/30/2007  . HEPATIC CYST 04/30/2007  . URETEROLITHIASIS 04/30/2007  . ARTHRITIS 04/30/2007  . ABDOMINAL PAIN, RIGHT LOWER QUADRANT 04/30/2007  . EPIGASTRIC DISCOMFORT 04/30/2007  . COLONIC POLYPS, HYPERPLASTIC 04/01/2007   Aldona Lento, PTA  Aldona Lento 10/12/2014, 4:42 PM  [facility] [address] [place], [place], [address] Phone: [phone]   Fax:  [phone]

## 2014-10-23 ENCOUNTER — Ambulatory Visit (HOSPITAL_COMMUNITY): Payer: 59 | Admitting: Physical Therapy

## 2014-10-23 DIAGNOSIS — R29898 Other symptoms and signs involving the musculoskeletal system: Secondary | ICD-10-CM

## 2014-10-23 DIAGNOSIS — R293 Abnormal posture: Secondary | ICD-10-CM

## 2014-10-23 DIAGNOSIS — M542 Cervicalgia: Secondary | ICD-10-CM | POA: Diagnosis not present

## 2014-10-23 DIAGNOSIS — R6884 Jaw pain: Secondary | ICD-10-CM

## 2014-10-23 DIAGNOSIS — M62838 Other muscle spasm: Secondary | ICD-10-CM

## 2014-10-23 NOTE — Therapy (Signed)
[place], [place], [address] Phone: [phone]   Fax:  [phone]  Physical Therapy Treatment  Patient Details  Name: Shelley Petersen MRN: [id number] Date of Birth: 1950/05/28 Referring Provider:  Verner Chol, MD  Encounter Date: 10/23/2014      PT End of Session - 10/23/14 1852    Visit Number 3   Number of Visits 12   Date for PT Re-Evaluation 11/09/14   Authorization Type [employer]   PT Start Time 6789   PT Stop Time 1740   PT Time Calculation (min) 50 min   Activity Tolerance Patient tolerated treatment well   Behavior During Therapy [facility] for tasks assessed/performed      Past Medical History  Diagnosis Date  . Hypertension   . GERD (gastroesophageal reflux disease)   . Diverticulosis   . Hemorrhoids   . Esophageal stricture   . Hiatal hernia   . Hyperplastic colon polyp   . Diverticulitis   . Headache   . Neck pain   . Cervical strain 10/11/2014  . Cervicogenic headache 10/11/2014    Past Surgical History  Procedure Laterality Date  . Tonsillectomy    . Shoulder spur surgery Right   . Dental implants    . Knee arthroscopy w/ meniscal repair Left   . Ankle surgery Left   . Abdominal hysterectomy      There were no vitals filed for this visit.  Visit Diagnosis:  Cervicalgia  Decreased ROM of neck  Muscle spasms of head and/or neck  Jaw pain, non-TMJ  Abnormal posture      Subjective Assessment - 10/23/14 1850    Subjective PT states she continues to have headaches in occipital regiona and pain in her TMJ bilaterally.  Current pain 2/10.     Currently in Pain? Yes   Pain Score 2                          [facility] Adult PT Treatment/Exercise - 10/23/14 1701    Neck Exercises: Seated   Neck Retraction 10 reps;3 secs   Neck Retraction Limitations chin tuck   Cervical Rotation Both;10 reps   Cervical Rotation Limitations 3D cervical excursion   Lateral  Flexion Both;10 reps   Lateral Flexion Limitations 3D cervical excursion   Other Seated Exercise scapular retraction 10x   Other Seated Exercise TMJ:  jaw iso against fist under chin,  lateral isometrics, tongue to roof of mouth and protrude jaw for stretch all 10 reps   Manual Therapy   Manual Therapy Soft tissue mobilization   Soft tissue mobilization Bil UT, levator scapula, SCM, scalenes and fascial with emphasis on TMJ   Neck Exercises: Stretches   Upper Trapezius Stretch 3 reps;20 seconds   Upper Trapezius Stretch Limitations seated lateral sidebend                  PT Short Term Goals - 10/12/14 1530    PT SHORT TERM GOAL #1   Title Pt will be independent with HEP   Status On-going   PT SHORT TERM GOAL #2   Title Improve cervical ROM by 10 degrees for extension, left sidebending, and bilateral rotation to improve mobility.    Status On-going   PT SHORT TERM GOAL #3   Title Pt will work for 3 hours without pain in her neck or head.  PT Long Term Goals - 10/12/14 1531    PT LONG TERM GOAL #1   Title Pt will be independent with advanced HEP.    PT LONG TERM GOAL #2   Title Improve cervical ROM by 15 degrees for right sidebending and bilateral rotation to allow for increased mobility.   PT LONG TERM GOAL #3   Title Decrease muscle spasm in bilateral upper traps, SCM, and levator scap to decrease headaches and jaw pain.    Status On-going   PT LONG TERM GOAL #4   Title Pt will work 8 hours without increased pain in head or neck.                Plan - 10/23/14 1854    Clinical Impression Statement Continued with manual for cervcial, occipital and TMJ regions.  Pt reported no decreased pain at EOS, however able to move her head around more wtihout discomfort.  PT educated in TMJ exercises and continued with established exercises for cervidal region.  Cervical extension results in dizziness.    PT Next Visit Plan Continue with current PT POC for  cervical and scapular stabilization.  Next session give pt HEP and progress with scapular strengthening.         Problem List Patient Active Problem List   Diagnosis Date Noted  . Cervical strain 10/11/2014  . Cervicogenic headache 10/11/2014  . Diarrhea 03/24/2013  . LLQ abdominal pain 12/26/2012  . Hx of diverticulitis of colon 12/26/2012  . DIVERTICULITIS-COLON 06/09/2010  . GERD 10/07/2007  . IRRITABLE BOWEL SYNDROME 10/07/2007  . HYPERTENSION 04/30/2007  . INTERNAL HEMORRHOIDS 04/30/2007  . HEMORRHOIDS, EXTERNAL 04/30/2007  . DIVERTICULAR DISEASE 04/30/2007  . RECTAL BLEEDING 04/30/2007  . HEPATIC CYST 04/30/2007  . URETEROLITHIASIS 04/30/2007  . ARTHRITIS 04/30/2007  . ABDOMINAL PAIN, RIGHT LOWER QUADRANT 04/30/2007  . EPIGASTRIC DISCOMFORT 04/30/2007  . COLONIC POLYPS, HYPERPLASTIC 04/01/2007    Teena Irani, PTA/CLT [phone]  10/23/2014, 7:01 PM  [facility] [address] [place], [place], [address] Phone: [phone]   Fax:  [phone]

## 2014-10-25 ENCOUNTER — Ambulatory Visit (HOSPITAL_COMMUNITY): Payer: 59

## 2014-10-25 DIAGNOSIS — M62838 Other muscle spasm: Secondary | ICD-10-CM

## 2014-10-25 DIAGNOSIS — M542 Cervicalgia: Secondary | ICD-10-CM

## 2014-10-25 DIAGNOSIS — R29898 Other symptoms and signs involving the musculoskeletal system: Secondary | ICD-10-CM

## 2014-10-25 DIAGNOSIS — R293 Abnormal posture: Secondary | ICD-10-CM

## 2014-10-25 DIAGNOSIS — R6884 Jaw pain: Secondary | ICD-10-CM

## 2014-10-25 NOTE — Therapy (Signed)
[place], [place], [address] Phone: [phone]   Fax:  [phone]  Physical Therapy Treatment  Patient Details  Name: Shelley Petersen MRN: [id number] Date of Birth: [birth year] Referring Provider:  Sharilyn Sites, MD  Encounter Date: 10/25/2014      PT End of Session - 10/25/14 1617    Visit Number 4   Number of Visits 12   Date for PT Re-Evaluation 11/09/14   Authorization Type [employer] [facility]   PT Start Time 4818   PT Stop Time 1648   PT Time Calculation (min) 44 min   Activity Tolerance Patient tolerated treatment well   Behavior During Therapy [facility] for tasks assessed/performed      Past Medical History  Diagnosis Date  . Hypertension   . GERD (gastroesophageal reflux disease)   . Diverticulosis   . Hemorrhoids   . Esophageal stricture   . Hiatal hernia   . Hyperplastic colon polyp   . Diverticulitis   . Headache   . Neck pain   . Cervical strain 10/11/2014  . Cervicogenic headache 10/11/2014    Past Surgical History  Procedure Laterality Date  . Tonsillectomy    . Shoulder spur surgery Right   . Dental implants    . Knee arthroscopy w/ meniscal repair Left   . Ankle surgery Left   . Abdominal hysterectomy      There were no vitals filed for this visit.  Visit Diagnosis:  Cervicalgia  Decreased ROM of neck  Muscle spasms of head and/or neck  Jaw pain, non-TMJ  Abnormal posture      Subjective Assessment - 10/25/14 1605    Subjective Pt stated she continues to have posterior neck, headaches and TMJ pain scale 2/10   Currently in Pain? Yes   Pain Score 2    Pain Location Hand  Headache, neck and TMJ pain   Pain Orientation Right;Left;Posterior            [facility] Adult PT Treatment/Exercise - 10/25/14 0001    Exercises   Exercises Neck   Neck Exercises: Seated   Neck Retraction 10 reps;3 secs   Neck Retraction Limitations chin tuck   Cervical Rotation Both;10 reps   Cervical Rotation Limitations 3D cervical excursion   Lateral Flexion Both;10 reps   Lateral Flexion Limitations 3D cervical excursion   W Back 10 reps   Shoulder Rolls Backwards;5 reps   Shoulder Rolls Limitations up, back and relax    Other Seated Exercise scapular retraction 10x   Other Seated Exercise TMJ:  jaw iso against fist under chin,  lateral isometrics, tongue to roof of mouth and protrude jaw for stretch all 10 reps   Manual Therapy   Manual Therapy Soft tissue mobilization   Manual therapy comments Bil Scapular region in prone, Bil UT and MT, levator scapula, scalenes, and fascial with focus on temporalis and massater in supine with LE elevated   Neck Exercises: Stretches   Upper Trapezius Stretch 3 reps;20 seconds   Other Neck Stretches rhomboid stretch 3x 30"              PT Short Term Goals - 10/25/14 1836    PT SHORT TERM GOAL #1   Title Pt will be independent with HEP   Status On-going   PT SHORT TERM GOAL #2   Title Improve cervical ROM by 10 degrees for extension, left sidebending, and bilateral rotation to improve mobility.    Status On-going  PT SHORT TERM GOAL #3   Title Pt will work for 3 hours without pain in her neck or head.   Status On-going           PT Long Term Goals - 10/25/14 1836    PT LONG TERM GOAL #1   Title Pt will be independent with advanced HEP.    PT LONG TERM GOAL #2   Title Improve cervical ROM by 15 degrees for right sidebending and bilateral rotation to allow for increased mobility.   PT LONG TERM GOAL #3   Title Decrease muscle spasm in bilateral upper traps, SCM, and levator scap to decrease headaches and jaw pain.    Status On-going   PT LONG TERM GOAL #4   Title Pt will work 8 hours without increased pain in head or neck.                Plan - 10/25/14 1617    Clinical Impression Statement Reviewed TMJ exercises and copy of HEP given with ability to demonstrate appropriate technqiue with all new exercises.   Progressed scapular strengthening with min cueing for form.  Manual focus on reducing tightness temporalis, massater, upper and mid traps, and posterior cervical region, added prone work with focus on reducing spasms Lt upper and Bil rhomboid area.  Added rhomboid stretch to improve flexibility.  Pt stated pain scale 2/10 at end of session with noted improve cervical rotation and decreased overall tightness.  Pt encouraged to stay hydrated to reduce risk of headache following manual.     PT Next Visit Plan Continue with current PT POC for cervical and scapular stabilization.  Next session progress with scapular strengthening. F/U with compliance with TMJ exercises.          Problem List Patient Active Problem List   Diagnosis Date Noted  . Cervical strain 10/11/2014  . Cervicogenic headache 10/11/2014  . Diarrhea 03/24/2013  . LLQ abdominal pain 12/26/2012  . Hx of diverticulitis of colon 12/26/2012  . DIVERTICULITIS-COLON 06/09/2010  . GERD 10/07/2007  . IRRITABLE BOWEL SYNDROME 10/07/2007  . HYPERTENSION 04/30/2007  . INTERNAL HEMORRHOIDS 04/30/2007  . HEMORRHOIDS, EXTERNAL 04/30/2007  . DIVERTICULAR DISEASE 04/30/2007  . RECTAL BLEEDING 04/30/2007  . HEPATIC CYST 04/30/2007  . URETEROLITHIASIS 04/30/2007  . ARTHRITIS 04/30/2007  . ABDOMINAL PAIN, RIGHT LOWER QUADRANT 04/30/2007  . EPIGASTRIC DISCOMFORT 04/30/2007  . COLONIC POLYPS, HYPERPLASTIC 04/01/2007   Ihor Austin, LPTA; CBIS [phone]  Aldona Lento 10/25/2014, 6:37 PM  [facility] [address] [place], [place], [address] Phone: [phone]   Fax:  [phone]

## 2014-10-30 ENCOUNTER — Ambulatory Visit (HOSPITAL_COMMUNITY): Payer: 59 | Admitting: Physical Therapy

## 2014-10-30 DIAGNOSIS — R6884 Jaw pain: Secondary | ICD-10-CM

## 2014-10-30 DIAGNOSIS — M62838 Other muscle spasm: Secondary | ICD-10-CM

## 2014-10-30 DIAGNOSIS — R293 Abnormal posture: Secondary | ICD-10-CM

## 2014-10-30 DIAGNOSIS — M542 Cervicalgia: Secondary | ICD-10-CM

## 2014-10-30 DIAGNOSIS — R29898 Other symptoms and signs involving the musculoskeletal system: Secondary | ICD-10-CM

## 2014-10-30 NOTE — Therapy (Signed)
[place], [place], [address] Phone: [phone]   Fax:  [phone]  Physical Therapy Treatment  Patient Details  Name: Shelley Petersen MRN: [id number] Date of Birth: 1951-01-21 Referring Provider:  Verner Chol, MD  Encounter Date: 10/30/2014      PT End of Session - 10/30/14 1716    Visit Number 5   Number of Visits 12   Date for PT Re-Evaluation 11/09/14   Authorization Type [employer]   PT Start Time 1630   PT Stop Time 1713   PT Time Calculation (min) 43 min   Activity Tolerance Patient tolerated treatment well   Behavior During Therapy [facility] for tasks assessed/performed      Past Medical History  Diagnosis Date  . Hypertension   . GERD (gastroesophageal reflux disease)   . Diverticulosis   . Hemorrhoids   . Esophageal stricture   . Hiatal hernia   . Hyperplastic colon polyp   . Diverticulitis   . Headache   . Neck pain   . Cervical strain 10/11/2014  . Cervicogenic headache 10/11/2014    Past Surgical History  Procedure Laterality Date  . Tonsillectomy    . Shoulder spur surgery Right   . Dental implants    . Knee arthroscopy w/ meniscal repair Left   . Ankle surgery Left   . Abdominal hysterectomy      There were no vitals filed for this visit.  Visit Diagnosis:  Cervicalgia  Decreased ROM of neck  Muscle spasms of head and/or neck  Jaw pain, non-TMJ  Abnormal posture      Subjective Assessment - 10/30/14 1629    Subjective Patient continues to have neck and TMJ discomfort; weekend was rough    Currently in Pain? Yes   Pain Score 2    Pain Location --  neck and jaw                          [facility] Adult PT Treatment/Exercise - 10/30/14 0001    Neck Exercises: Standing   Other Standing Exercises Gait with thoracic rotation to improve overall stiffness and improve mechanics [id number]    Neck Exercises: Seated   Neck Retraction 10 reps   Shoulder Rolls  Backwards;10 reps   Other Seated Exercise 3D cervical and thoracic excursions 1x10   Other Seated Exercise TMJ: 3D excursions 1x10, fist under chin 3x10 seconds, lateral isometrics 3x10 seconds   Manual Therapy   Manual Therapy Soft tissue mobilization   Manual therapy comments bilateral scapular region prone, bilateral upper trap and cervical extensors in sitting; attempted MEt to bilateral Masseter and Temporalis but increased headache pain and no apparent beneficial effects    Neck Exercises: Stretches   Upper Trapezius Stretch 3 reps;30 seconds   Upper Trapezius Stretch Limitations seated    Corner Stretch 3 reps;20 seconds                PT Education - 10/30/14 1716    Education provided No          PT Short Term Goals - 10/25/14 1836    PT SHORT TERM GOAL #1   Title Pt will be independent with HEP   Status On-going   PT SHORT TERM GOAL #2   Title Improve cervical ROM by 10 degrees for extension, left sidebending, and bilateral rotation to improve mobility.    Status On-going   PT SHORT TERM  GOAL #3   Title Pt will work for 3 hours without pain in her neck or head.   Status On-going           PT Long Term Goals - 10/25/14 1836    PT LONG TERM GOAL #1   Title Pt will be independent with advanced HEP.    PT LONG TERM GOAL #2   Title Improve cervical ROM by 15 degrees for right sidebending and bilateral rotation to allow for increased mobility.   PT LONG TERM GOAL #3   Title Decrease muscle spasm in bilateral upper traps, SCM, and levator scap to decrease headaches and jaw pain.    Status On-going   PT LONG TERM GOAL #4   Title Pt will work 8 hours without increased pain in head or neck.                Plan - 10/30/14 1716    Clinical Impression Statement Continued functional exercises and stretches with pateint today, also continued manual to bilateral cervical extensors, upper traps, and scapular region. Attempted MET to bilateral masseter and  temporalis today but did not appear to affect patient's pain or produce beneficial effects. Introduced child's pose today as well. No increase or decrease in pain at end of session however patient appeared t have improved mechanicaly with reduced tightness during gait.    Pt will benefit from skilled therapeutic intervention in order to improve on the following deficits Decreased activity tolerance;Decreased range of motion;Decreased strength;Increased muscle spasms;Postural dysfunction;Pain   Rehab Potential Good   PT Frequency 2x / week   PT Duration 6 weeks   PT Treatment/Interventions Moist Heat;Traction;Therapeutic activities;Therapeutic exercise;Neuromuscular re-education;Manual techniques;Passive range of motion   PT Next Visit Plan Continue with current PT POC for cervical and scapular stabilization.  Next session progress with scapular strengthening. F/U with compliance with TMJ exercises.     Consulted and Agree with Plan of Care Patient        Problem List Patient Active Problem List   Diagnosis Date Noted  . Cervical strain 10/11/2014  . Cervicogenic headache 10/11/2014  . Diarrhea 03/24/2013  . LLQ abdominal pain 12/26/2012  . Hx of diverticulitis of colon 12/26/2012  . DIVERTICULITIS-COLON 06/09/2010  . GERD 10/07/2007  . IRRITABLE BOWEL SYNDROME 10/07/2007  . HYPERTENSION 04/30/2007  . INTERNAL HEMORRHOIDS 04/30/2007  . HEMORRHOIDS, EXTERNAL 04/30/2007  . DIVERTICULAR DISEASE 04/30/2007  . RECTAL BLEEDING 04/30/2007  . HEPATIC CYST 04/30/2007  . URETEROLITHIASIS 04/30/2007  . ARTHRITIS 04/30/2007  . ABDOMINAL PAIN, RIGHT LOWER QUADRANT 04/30/2007  . EPIGASTRIC DISCOMFORT 04/30/2007  . COLONIC POLYPS, HYPERPLASTIC 04/01/2007   Deniece Ree PT, DPT [facility] [address] [place], [place], [address] Phone: [phone]   Fax:  [phone]

## 2014-11-01 ENCOUNTER — Ambulatory Visit (HOSPITAL_COMMUNITY): Payer: 59

## 2014-11-01 DIAGNOSIS — M542 Cervicalgia: Secondary | ICD-10-CM | POA: Diagnosis not present

## 2014-11-01 DIAGNOSIS — M62838 Other muscle spasm: Secondary | ICD-10-CM

## 2014-11-01 DIAGNOSIS — R293 Abnormal posture: Secondary | ICD-10-CM

## 2014-11-01 DIAGNOSIS — R29898 Other symptoms and signs involving the musculoskeletal system: Secondary | ICD-10-CM

## 2014-11-01 DIAGNOSIS — R6884 Jaw pain: Secondary | ICD-10-CM
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
## 2014-11-05 ENCOUNTER — Ambulatory Visit (HOSPITAL_COMMUNITY): Payer: 59 | Admitting: Physical Therapy

## 2014-11-05 DIAGNOSIS — M62838 Other muscle spasm: Secondary | ICD-10-CM

## 2014-11-05 DIAGNOSIS — R293 Abnormal posture: Secondary | ICD-10-CM

## 2014-11-05 DIAGNOSIS — R29898 Other symptoms and signs involving the musculoskeletal system: Secondary | ICD-10-CM

## 2014-11-05 DIAGNOSIS — R6884 Jaw pain: Secondary | ICD-10-CM

## 2014-11-05 DIAGNOSIS — M542 Cervicalgia: Secondary | ICD-10-CM
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
## 2014-11-08 ENCOUNTER — Ambulatory Visit (HOSPITAL_COMMUNITY): Payer: 59 | Admitting: Physical Therapy

## 2014-11-08 DIAGNOSIS — R293 Abnormal posture: Secondary | ICD-10-CM

## 2014-11-08 DIAGNOSIS — M62838 Other muscle spasm: Secondary | ICD-10-CM

## 2014-11-08 DIAGNOSIS — R29898 Other symptoms and signs involving the musculoskeletal system: Secondary | ICD-10-CM

## 2014-11-08 DIAGNOSIS — M542 Cervicalgia: Secondary | ICD-10-CM | POA: Diagnosis not present

## 2014-11-08 DIAGNOSIS — R6884 Jaw pain: Secondary | ICD-10-CM

## 2014-11-08 NOTE — Patient Instructions (Signed)
Scalene Stretch, Sitting   Sit, one hand tucked under hip on side to be stretched, other hand over top of head. Gently pull head to side and backwards. Hold ___ seconds. For more stretch, lean body in direction of head pull. Repeat ___ times per session. Do ___ sessions per day.  Copyright  V[employer]. All rights reserved.

## 2014-11-08 NOTE — Therapy (Signed)
[place], [place], [address] Phone: [phone]   Fax:  [phone]  Physical Therapy Treatment (Reassessment)  Patient Details  Name: Shelley Petersen MRN: [id number] Date of Birth: 29-Nov-1950 Referring Provider:  Verner Chol, MD  Encounter Date: 11/08/2014      PT End of Session - 11/08/14 1703    Visit Number 8   Number of Visits 12   Date for PT Re-Evaluation 12/06/14   Authorization Type [facility] [employer] [facility]   PT Start Time [phone]   PT Stop Time 1014   PT Time Calculation (min) 43 min   Activity Tolerance Patient tolerated treatment well   Behavior During Therapy [facility] for tasks assessed/performed      Past Medical History  Diagnosis Date  . Hypertension   . GERD (gastroesophageal reflux disease)   . Diverticulosis   . Hemorrhoids   . Esophageal stricture   . Hiatal hernia   . Hyperplastic colon polyp   . Diverticulitis   . Headache   . Neck pain   . Cervical strain 10/11/2014  . Cervicogenic headache 10/11/2014    Past Surgical History  Procedure Laterality Date  . Tonsillectomy    . Shoulder spur surgery Right   . Dental implants    . Knee arthroscopy w/ meniscal repair Left   . Ankle surgery Left   . Abdominal hysterectomy      There were no vitals filed for this visit.  Visit Diagnosis:  Cervicalgia  Decreased ROM of neck  Muscle spasms of head and/or neck  Jaw pain, non-TMJ  Abnormal posture      Subjective Assessment - 11/08/14 0935    Subjective Pt reports that she has had decreased tightness in her neck, but her jaw is still bothering her. She is still unable to lay on either side without pain in TMJ. She continues to experience headaches at the back of her skull almost every day. She believes that therapy is helping, but she is still having difficulty with bending and turning her neck.    How long can you sit comfortably? no pain   How long can you stand comfortably? 45  minutes   How long can you walk comfortably? no pain   Currently in Pain? Yes   Pain Score 2    Pain Location Jaw   Pain Orientation Right;Left            [facility] PT Assessment - 11/08/14 0001    Assessment   Medical Diagnosis Neck strain   AROM   Cervical Flexion 48   Cervical Extension 38   Cervical - Right Side Bend 36   Cervical - Left Side Bend 38   Cervical - Right Rotation 42   Cervical - Left Rotation 53   Palpation   Spinal mobility Muscle guarding present with cervical mobility testing. Cervical upslide at C3/4 were tender   Palpation comment Increased tenderness in bilateral upper trap, bilateral levator scap, bilateral SCM L>R                     [facility] Adult PT Treatment/Exercise - 11/08/14 0001    Neck Exercises: Seated   Other Seated Exercise 3D cervical excursions x 10   Other Seated Exercise scapular retraction x 15   Neck Exercises: Supine   Neck Retraction 10 reps;3 secs   Other Supine Exercise jaw protraction/retraction, medial/lateral movements x 10 each   Manual Therapy   Manual Therapy  Soft tissue mobilization;Myofascial release   Manual therapy comments soft tissue mobilization to bilateral SCM, scalenes, buccinator, masseter x 15 minutes   Manual Traction cervical traction  5 minutes                PT Education - 11/08/14 1703    Education provided Yes   Education Details HEP, goals   Person(s) Educated Patient   Methods Explanation;Handout   Comprehension Verbalized understanding          PT Short Term Goals - 11/08/14 1706    PT SHORT TERM GOAL #1   Title Pt will be independent with HEP   Time 3   Period Weeks   Status Achieved   PT SHORT TERM GOAL #2   Title Improve cervical ROM by 10 degrees for extension, left sidebending, and bilateral rotation to improve mobility.    Time 3   Period Weeks   Status On-going   PT SHORT TERM GOAL #3   Title Pt will work for 3 hours without pain in her neck or head.   Time  3   Period Weeks   Status Unable to assess           PT Long Term Goals - 11/08/14 1706    PT LONG TERM GOAL #1   Title Pt will be independent with advanced HEP.    Time 6   Period Weeks   Status On-going   PT LONG TERM GOAL #2   Title Improve cervical ROM by 15 degrees for right sidebending and bilateral rotation to allow for increased mobility.   Time 6   Period Weeks   Status On-going   PT LONG TERM GOAL #3   Title Decrease muscle spasm in bilateral upper traps, SCM, and levator scap to decrease headaches and jaw pain.    Time 6   Period Weeks   Status On-going   PT LONG TERM GOAL #4   Title Pt will work 8 hours without increased pain in head or neck.    Time 6   Period Weeks   Status Unable to assess               Plan - 11/08/14 1704    Clinical Impression Statement Reassessment was completed today. Pt continues to demonstrate limitations in cervical AROM, limiting her ability to complete functional tasks such as ADLs and driving. She demonstrates significant tightness in bilateral SCM, masseter, buccinator, scalenes, and upper trap. Pt is scheduled to return to work on 8/8, and will be out of town until that point. 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    PT Frequency 2x / week   PT Duration --  2-3 weeks   PT Next Visit Plan Follow up with pt regarding sx at work        Problem List Patient Active Problem List   Diagnosis Date Noted  . Cervical strain 10/11/2014  . Cervicogenic headache 10/11/2014  . Diarrhea 03/24/2013  . LLQ abdominal pain 12/26/2012  . Hx of diverticulitis of colon 12/26/2012  . DIVERTICULITIS-COLON 06/09/2010  . GERD 10/07/2007  . IRRITABLE BOWEL SYNDROME 10/07/2007  . HYPERTENSION 04/30/2007  . INTERNAL HEMORRHOIDS 04/30/2007  . HEMORRHOIDS, EXTERNAL 04/30/2007  . DIVERTICULAR DISEASE  04/30/2007  . RECTAL BLEEDING 04/30/2007  . HEPATIC CYST 04/30/2007  . URETEROLITHIASIS 04/30/2007  . ARTHRITIS 04/30/2007  .  ABDOMINAL PAIN, RIGHT LOWER QUADRANT 04/30/2007  . EPIGASTRIC DISCOMFORT 04/30/2007  . COLONIC POLYPS, HYPERPLASTIC 04/01/2007    Hilma Favors, PT, DPT [phone] 11/08/2014, 5:08 PM  [place] [address] [place], [place], [address] Phone: [phone]   Fax:  [phone]

## 2014-11-22 ENCOUNTER — Encounter: Payer: Self-pay | Admitting: Adult Health

## 2014-11-22 ENCOUNTER — Ambulatory Visit (INDEPENDENT_AMBULATORY_CARE_PROVIDER_SITE_OTHER): Payer: 59 | Admitting: Adult Health

## 2014-11-22 VITALS — BP 115/84 | HR 85 | Ht 59.0 in | Wt 136.0 lb

## 2014-11-22 DIAGNOSIS — S161XXD Strain of muscle, fascia and tendon at neck level, subsequent encounter: Secondary | ICD-10-CM | POA: Diagnosis not present

## 2014-11-22 DIAGNOSIS — G4486 Cervicogenic headache: Secondary | ICD-10-CM

## 2014-11-22 DIAGNOSIS — R51 Headache: Secondary | ICD-10-CM

## 2014-11-22 NOTE — Patient Instructions (Signed)
We will check MRI of cervical spine.  If your symptoms worsen or you develop new symptoms please let us know.
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
## 2014-11-22 NOTE — Progress Notes (Signed)
I have read the note, and I agree with the clinical assessment and plan.  WILLIS,CHARLES KEITH   

## 2014-12-05 ENCOUNTER — Ambulatory Visit (INDEPENDENT_AMBULATORY_CARE_PROVIDER_SITE_OTHER): Payer: 59

## 2014-12-05 DIAGNOSIS — G4486 Cervicogenic headache: Secondary | ICD-10-CM

## 2014-12-05 DIAGNOSIS — S161XXD Strain of muscle, fascia and tendon at neck level, subsequent encounter: Secondary | ICD-10-CM

## 2014-12-05 DIAGNOSIS — R51 Headache: Secondary | ICD-10-CM | POA: Diagnosis not present

## 2014-12-05 MED ORDER — GADOPENTETATE DIMEGLUMINE 469.01 MG/ML IV SOLN: 12.0000 mL | Freq: Once | INTRAVENOUS | Status: DC | PRN

## 2014-12-06 ENCOUNTER — Telehealth: Payer: Self-pay | Admitting: *Deleted

## 2014-12-06 NOTE — Telephone Encounter (Signed)
I called pt and gave her the MRI results of C spine.   She wanted to update [place] on her sx.  She stated after walking over 5 miles she had sx again, Pain in neck, head and sharp stabbing pains in L head.  Taking tylenol and aleve for her headaches.  She mentioned that she did not think she could go back to work (she works housekeeping at the hospital).  She is scheduled to go back to work 12-13-14.  She had been out since 08-26-14 (this last time was by chiropractor).  I told her I would relay the message to her, she requested a call back by [facility].

## 2014-12-06 NOTE — Telephone Encounter (Signed)
I called the patient. She is still having discomfort in the neck. She is also having sharp pains in the left side of the head. She is unsure if this is stemming from the neck. She does report that her neck is very tight. She is currently in therapy. She states that they've been Ultrasound therapy on her neck. The patient's cervical spine MRI did not show any acute changes that would result in her symptoms. I've encouraged patient to continue with therapy. She is on amitriptyline however she does not know the dosage. I have advised her to call our office in the morning and let me know what dose she is on this could potentially be increased. She is also on Robaxin for muscle spasms.

## 2014-12-10 ENCOUNTER — Encounter: Payer: Self-pay | Admitting: Adult Health

## 2014-12-10 NOTE — Telephone Encounter (Signed)
Pt called to let Ward Givens know that the dosage of the Amitriptyline is 50mg  tablets Once a day at bedtime.

## 2014-12-10 NOTE — Telephone Encounter (Signed)
I called the patient. She states that she is not taking the amitriptyline because she does not like the way makes her feel. I advised her that she should make her orthopedic doctor aware since he prescribed this. She states that she is on 3-4 days without having the sharp shooting pains in the left occipital region. I have encouraged the patient continue using Robaxin. She is currently in therapy I have encouraged her to finish this and completed exercises while at home. In the future of the sharp shooting pains return we can try prednisone Dosepak. Patient is asking for a work note, Her FMLA has expired. I will provide the patient for a work note to be out for the remainder of the week. I've instructed the patient that during this time she should try to rest and avoid strenuous activity. Patient verbalized understanding.

## 2014-12-11 ENCOUNTER — Telehealth: Payer: Self-pay | Admitting: Adult Health

## 2014-12-11 NOTE — Telephone Encounter (Signed)
Patient is calling and states that Shelley Petersen has an out of work letter that she prepared for her.  She states the letter needs to be faxed to [employer], Attn: Mark at fax [phone].  Please call patient.

## 2014-12-11 NOTE — Telephone Encounter (Signed)
Faxed patient's work note to [employer] [phone].

## 2014-12-12 NOTE — Telephone Encounter (Signed)
Re faxed work note patient aware.

## 2014-12-12 NOTE — Telephone Encounter (Signed)
Patient is calling stating she was given the wrong fax number to fax the work note. Please fax the work note to [phone]  ATTN: Ronalee Belts. Thank you.

## 2014-12-18 ENCOUNTER — Other Ambulatory Visit: Payer: Self-pay | Admitting: Adult Health

## 2015-01-09 DIAGNOSIS — Z0289 Encounter for other administrative examinations: Secondary | ICD-10-CM

## 2015-01-14 ENCOUNTER — Ambulatory Visit: Payer: 59 | Admitting: Adult Health

## 2015-01-21 ENCOUNTER — Other Ambulatory Visit (HOSPITAL_COMMUNITY): Payer: Self-pay | Admitting: Family Medicine

## 2015-01-21 DIAGNOSIS — Z0271 Encounter for disability determination: Secondary | ICD-10-CM

## 2015-01-21 DIAGNOSIS — Z1231 Encounter for screening mammogram for malignant neoplasm of breast: Secondary | ICD-10-CM

## 2015-02-05 ENCOUNTER — Ambulatory Visit (INDEPENDENT_AMBULATORY_CARE_PROVIDER_SITE_OTHER): Payer: 59 | Admitting: Urology

## 2015-02-05 DIAGNOSIS — N941 Unspecified dyspareunia: Secondary | ICD-10-CM

## 2015-02-05 DIAGNOSIS — N952 Postmenopausal atrophic vaginitis: Secondary | ICD-10-CM | POA: Diagnosis not present

## 2015-02-11 ENCOUNTER — Ambulatory Visit (HOSPITAL_COMMUNITY)
Admission: RE | Admit: 2015-02-11 | Discharge: 2015-02-11 | Disposition: A | Discharge status: Home / Self Care | Payer: 59 | Source: Ambulatory Visit | Attending: Family Medicine | Admitting: Family Medicine

## 2015-02-11 DIAGNOSIS — Z1231 Encounter for screening mammogram for malignant neoplasm of breast: Secondary | ICD-10-CM | POA: Insufficient documentation

## 2015-02-25 ENCOUNTER — Encounter: Payer: Self-pay | Admitting: Adult Health

## 2015-02-25 ENCOUNTER — Ambulatory Visit (INDEPENDENT_AMBULATORY_CARE_PROVIDER_SITE_OTHER): Payer: 59 | Admitting: Adult Health

## 2015-02-25 VITALS — BP 128/78 | HR 72 | Ht 59.0 in | Wt 132.5 lb

## 2015-02-25 DIAGNOSIS — R0681 Apnea, not elsewhere classified: Secondary | ICD-10-CM

## 2015-02-25 DIAGNOSIS — R51 Headache: Secondary | ICD-10-CM

## 2015-02-25 DIAGNOSIS — S161XXD Strain of muscle, fascia and tendon at neck level, subsequent encounter: Secondary | ICD-10-CM | POA: Diagnosis not present

## 2015-02-25 DIAGNOSIS — R0683 Snoring: Secondary | ICD-10-CM

## 2015-02-25 DIAGNOSIS — R519 Headache, unspecified: Secondary | ICD-10-CM

## 2015-02-25 NOTE — Progress Notes (Signed)
I have read the note, and I agree with the clinical assessment and plan.  Mayumi Summerson KEITH   
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
## 2015-02-25 NOTE — Patient Instructions (Signed)
MRI of brain Sleep study Pending MRI- we may need to add on a preventative medication for headache.

## 2015-02-26 ENCOUNTER — Ambulatory Visit (HOSPITAL_COMMUNITY)
Admission: RE | Admit: 2015-02-26 | Discharge: 2015-02-26 | Disposition: A | Discharge status: Home / Self Care | Payer: 59 | Source: Ambulatory Visit | Attending: Adult Health | Admitting: Adult Health

## 2015-02-26 DIAGNOSIS — S161XXD Strain of muscle, fascia and tendon at neck level, subsequent encounter: Secondary | ICD-10-CM

## 2015-02-26 DIAGNOSIS — R51 Headache: Secondary | ICD-10-CM | POA: Diagnosis present

## 2015-02-26 DIAGNOSIS — M542 Cervicalgia: Secondary | ICD-10-CM | POA: Insufficient documentation

## 2015-02-26 DIAGNOSIS — R519 Headache, unspecified: Secondary | ICD-10-CM

## 2015-02-26 LAB — POCT I-STAT CREATININE: Creatinine, Ser: 0.9 mg/dL (ref 0.44–1.00)

## 2015-02-26 MED ORDER — SODIUM CHLORIDE 0.9 % IV SOLN
INTRAVENOUS | Status: AC
  Filled 2015-02-26: qty 150

## 2015-02-26 MED ORDER — GADOBENATE DIMEGLUMINE 529 MG/ML IV SOLN
12.0000 mL | Freq: Once | INTRAVENOUS | Status: AC | PRN
  Administered 2015-02-26: 12 mL via INTRAVENOUS

## 2015-02-27 ENCOUNTER — Telehealth: Payer: Self-pay | Admitting: Adult Health

## 2015-02-27 MED ORDER — TIZANIDINE HCL 2 MG PO TABS: 2.0000 mg | ORAL_TABLET | Freq: Every day | ORAL | Status: DC

## 2015-02-27 NOTE — Telephone Encounter (Signed)
Pt called sts she had MRI last night. There was a cancellation and she was able to go last night.
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
## 2015-03-14 ENCOUNTER — Other Ambulatory Visit (HOSPITAL_COMMUNITY): Payer: 59

## 2015-03-19 ENCOUNTER — Ambulatory Visit (INDEPENDENT_AMBULATORY_CARE_PROVIDER_SITE_OTHER): Payer: 59 | Admitting: Neurology

## 2015-03-19 ENCOUNTER — Encounter: Payer: Self-pay | Admitting: Neurology

## 2015-03-19 VITALS — BP 100/64 | HR 78 | Resp 20 | Ht 59.5 in | Wt 126.0 lb

## 2015-03-19 DIAGNOSIS — F5102 Adjustment insomnia: Secondary | ICD-10-CM

## 2015-03-19 DIAGNOSIS — G4730 Sleep apnea, unspecified: Secondary | ICD-10-CM

## 2015-03-19 DIAGNOSIS — R0683 Snoring: Secondary | ICD-10-CM | POA: Diagnosis not present

## 2015-03-19 NOTE — Patient Instructions (Signed)
Insomnia Insomnia is a sleep disorder that makes it difficult to fall asleep or to stay asleep. Insomnia can cause tiredness (fatigue), low energy, difficulty concentrating, mood swings, and poor performance at work or school.  There are three different ways to classify insomnia:  Difficulty falling asleep.  Difficulty staying asleep.  Waking up too early in the morning. Any type of insomnia can be long-term (chronic) or short-term (acute). Both are common. Short-term insomnia usually lasts for three months or less. Chronic insomnia occurs at least three times a week for longer than three months. CAUSES  Insomnia may be caused by another condition, situation, or substance, such as:  Anxiety.  Certain medicines.  Gastroesophageal reflux disease (GERD) or other gastrointestinal conditions.  Asthma or other breathing conditions.  Restless legs syndrome, sleep apnea, or other sleep disorders.  Chronic pain.  Menopause. This may include hot flashes.  Stroke.  Abuse of alcohol, tobacco, or illegal drugs.  Depression.  Caffeine.   Neurological disorders, such as Alzheimer disease.  An overactive thyroid (hyperthyroidism). The cause of insomnia may not be known. RISK FACTORS Risk factors for insomnia include:  Gender. Women are more commonly affected than men.  Age. Insomnia is more common as you get older.  Stress. This may involve your professional or personal life.  Income. Insomnia is more common in people with lower income.  Lack of exercise.   Irregular work schedule or night shifts.  Traveling between different time zones. SIGNS AND SYMPTOMS If you have insomnia, trouble falling asleep or trouble staying asleep is the main symptom. This may lead to other symptoms, such as:  Feeling fatigued.  Feeling nervous about going to sleep.  Not feeling rested in the morning.  Having trouble concentrating.  Feeling irritable, anxious, or depressed. TREATMENT   Treatment for insomnia depends on the cause. If your insomnia is caused by an underlying condition, treatment will focus on addressing the condition. Treatment may also include:   Medicines to help you sleep.  Counseling or therapy.  Lifestyle adjustments. HOME CARE INSTRUCTIONS   Take medicines only as directed by your health care provider.  Keep regular sleeping and waking hours. Avoid naps.  Keep a sleep diary to help you and your health care provider figure out what could be causing your insomnia. Include:   When you sleep.  When you wake up during the night.  How well you sleep.   How rested you feel the next day.  Any side effects of medicines you are taking.  What you eat and drink.   Make your bedroom a comfortable place where it is easy to fall asleep:  Put up shades or special blackout curtains to block light from outside.  Use a white noise machine to block noise.  Keep the temperature cool.   Exercise regularly as directed by your health care provider. Avoid exercising right before bedtime.  Use relaxation techniques to manage stress. Ask your health care provider to suggest some techniques that may work well for you. These may include:  Breathing exercises.  Routines to release muscle tension.  Visualizing peaceful scenes.  Cut back on alcohol, caffeinated beverages, and cigarettes, especially close to bedtime. These can disrupt your sleep.  Do not overeat or eat spicy foods right before bedtime. This can lead to digestive discomfort that can make it hard for you to sleep.  Limit screen use before bedtime. This includes:  Watching TV.  Using your smartphone, tablet, and computer.  Stick to a routine. This   can help you fall asleep faster. Try to do a quiet activity, brush your teeth, and go to bed at the same time each night.  Get out of bed if you are still awake after 15 minutes of trying to sleep. Keep the lights down, but try reading or  doing a quiet activity. When you feel sleepy, go back to bed.  Make sure that you drive carefully. Avoid driving if you feel very sleepy.  Keep all follow-up appointments as directed by your health care provider. This is important. SEEK MEDICAL CARE IF:   You are tired throughout the day or have trouble in your daily routine due to sleepiness.  You continue to have sleep problems or your sleep problems get worse. SEEK IMMEDIATE MEDICAL CARE IF:   You have serious thoughts about hurting yourself or someone else.   This information is not intended to replace advice given to you by your health care provider. Make sure you discuss any questions you have with your health care provider.   Document Released: 03/27/2000 Document Revised: 12/19/2014 Document Reviewed: 12/29/2013 Elsevier Interactive Patient Education 2016 [employer]  
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
## 2015-04-04 ENCOUNTER — Telehealth: Payer: Self-pay | Admitting: Adult Health

## 2015-04-04 MED ORDER — TOPIRAMATE 25 MG PO TABS: 25.0000 mg | ORAL_TABLET | Freq: Every day | ORAL | Status: DC

## 2015-04-04 NOTE — Telephone Encounter (Signed)
Patient called and requested to speak with someone regarding the medication she started for her headaches. She states that it is causing her not to sleep and giving her bad dreams. Please call and advise.

## 2015-04-04 NOTE — Telephone Encounter (Signed)
I called the patient. She cannot tolerate the tizanidine. She will stop this medication. We will try Topamax 25 mg at bedtime. I have reviewed side effects with the patient. She did have a history of kidney stone however it was 10 years ago and it was only one stone. If she does not find this beneficial she will let us know.

## 2015-04-04 NOTE — Telephone Encounter (Signed)
I have spoken with Shelley Petersen--she sts. she has taken Tizanidine for several weeks, is still having h/a's, now having "crazy dreams" as well./fim

## 2015-04-09 ENCOUNTER — Encounter (INDEPENDENT_AMBULATORY_CARE_PROVIDER_SITE_OTHER): Payer: 59 | Admitting: Neurology

## 2015-04-09 DIAGNOSIS — R0683 Snoring: Secondary | ICD-10-CM

## 2015-04-09 DIAGNOSIS — G4733 Obstructive sleep apnea (adult) (pediatric): Secondary | ICD-10-CM

## 2015-04-09 DIAGNOSIS — G4730 Sleep apnea, unspecified: Secondary | ICD-10-CM

## 2015-04-09 DIAGNOSIS — F5102 Adjustment insomnia: Secondary | ICD-10-CM

## 2015-04-10 ENCOUNTER — Telehealth: Payer: Self-pay

## 2015-04-10 NOTE — Telephone Encounter (Signed)
Called pt to discuss sleep study results. No answer, left a message asking him to call me back. 

## 2015-04-10 NOTE — Telephone Encounter (Signed)
Spoke to pt regarding sleep study results. I advised pt that her study revealed hypoxemia and mild osa and treatment is advised. PAP therapy is suggested by Dr. Brett Fairy. Alternative therapies may include an oral appliance or ENT evaluation. Pt states money is tight and she doesn't have insurance until February. She does not want to start cpap at this time. She wants a follow up appt with Dr. Brett Fairy to discuss other options. I advised her to avoid sleeping on her back and to avoid driving or operating hazardous machinery when sleepy. Pt verbalized understanding. Pt needs an appt with Dr. Brett Fairy the morning of 05/20/15 because that is when she has her appt with Dr. Jannifer Franklin. Appt made for 10:15 on 2/6. Pt verbalized understanding.

## 2015-05-20 ENCOUNTER — Ambulatory Visit: Payer: 59 | Admitting: Neurology

## 2015-05-20 ENCOUNTER — Ambulatory Visit (INDEPENDENT_AMBULATORY_CARE_PROVIDER_SITE_OTHER): Payer: PPO | Admitting: Neurology

## 2015-05-20 ENCOUNTER — Encounter: Payer: Self-pay | Admitting: Neurology

## 2015-05-20 VITALS — BP 128/72 | HR 78 | Resp 20 | Ht 59.5 in | Wt 131.0 lb

## 2015-05-20 DIAGNOSIS — G4734 Idiopathic sleep related nonobstructive alveolar hypoventilation: Secondary | ICD-10-CM | POA: Diagnosis not present

## 2015-05-20 DIAGNOSIS — G4733 Obstructive sleep apnea (adult) (pediatric): Secondary | ICD-10-CM | POA: Diagnosis not present

## 2015-05-20 DIAGNOSIS — F4321 Adjustment disorder with depressed mood: Secondary | ICD-10-CM

## 2015-05-20 DIAGNOSIS — R0683 Snoring: Secondary | ICD-10-CM

## 2015-05-20 DIAGNOSIS — F4320 Adjustment disorder, unspecified: Secondary | ICD-10-CM | POA: Insufficient documentation

## 2015-05-20 NOTE — Patient Instructions (Signed)
Dental device referral.
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
## 2015-05-22 DIAGNOSIS — Z1389 Encounter for screening for other disorder: Secondary | ICD-10-CM | POA: Diagnosis not present

## 2015-05-22 DIAGNOSIS — Z0000 Encounter for general adult medical examination without abnormal findings: Secondary | ICD-10-CM | POA: Diagnosis not present

## 2015-05-22 DIAGNOSIS — Z6826 Body mass index (BMI) 26.0-26.9, adult: Secondary | ICD-10-CM | POA: Diagnosis not present

## 2015-06-25 ENCOUNTER — Other Ambulatory Visit (HOSPITAL_COMMUNITY): Payer: Self-pay | Admitting: Physician Assistant

## 2015-06-25 DIAGNOSIS — Z780 Asymptomatic menopausal state: Secondary | ICD-10-CM

## 2015-06-28 ENCOUNTER — Other Ambulatory Visit (HOSPITAL_COMMUNITY): Payer: 59

## 2015-07-05 ENCOUNTER — Ambulatory Visit (HOSPITAL_COMMUNITY)
Admission: RE | Admit: 2015-07-05 | Discharge: 2015-07-05 | Disposition: A | Discharge status: Home / Self Care | Payer: PPO | Source: Ambulatory Visit | Attending: Physician Assistant | Admitting: Physician Assistant

## 2015-07-05 DIAGNOSIS — M85851 Other specified disorders of bone density and structure, right thigh: Secondary | ICD-10-CM | POA: Diagnosis not present

## 2015-07-05 DIAGNOSIS — M8588 Other specified disorders of bone density and structure, other site: Secondary | ICD-10-CM | POA: Diagnosis not present

## 2015-07-05 DIAGNOSIS — Z780 Asymptomatic menopausal state: Secondary | ICD-10-CM | POA: Diagnosis not present

## 2015-08-16 DIAGNOSIS — H52221 Regular astigmatism, right eye: Secondary | ICD-10-CM | POA: Diagnosis not present

## 2015-08-16 DIAGNOSIS — H5213 Myopia, bilateral: Secondary | ICD-10-CM | POA: Diagnosis not present

## 2015-08-16 DIAGNOSIS — H524 Presbyopia: Secondary | ICD-10-CM | POA: Diagnosis not present

## 2015-08-22 DIAGNOSIS — M2241 Chondromalacia patellae, right knee: Secondary | ICD-10-CM | POA: Diagnosis not present

## 2015-08-26 DIAGNOSIS — M659 Synovitis and tenosynovitis, unspecified: Secondary | ICD-10-CM | POA: Diagnosis not present

## 2015-08-26 DIAGNOSIS — M23361 Other meniscus derangements, other lateral meniscus, right knee: Secondary | ICD-10-CM | POA: Diagnosis not present

## 2015-08-26 DIAGNOSIS — S83281A Other tear of lateral meniscus, current injury, right knee, initial encounter: Secondary | ICD-10-CM | POA: Diagnosis not present

## 2015-08-26 DIAGNOSIS — M23331 Other meniscus derangements, other medial meniscus, right knee: Secondary | ICD-10-CM | POA: Diagnosis not present

## 2015-08-26 DIAGNOSIS — S83241A Other tear of medial meniscus, current injury, right knee, initial encounter: Secondary | ICD-10-CM | POA: Diagnosis not present

## 2015-08-26 DIAGNOSIS — M12261 Villonodular synovitis (pigmented), right knee: Secondary | ICD-10-CM | POA: Diagnosis not present

## 2015-08-26 DIAGNOSIS — M94261 Chondromalacia, right knee: Secondary | ICD-10-CM | POA: Diagnosis not present

## 2015-09-02 DIAGNOSIS — S83281D Other tear of lateral meniscus, current injury, right knee, subsequent encounter: Secondary | ICD-10-CM | POA: Diagnosis not present

## 2015-09-02 DIAGNOSIS — S83241D Other tear of medial meniscus, current injury, right knee, subsequent encounter: Secondary | ICD-10-CM | POA: Diagnosis not present

## 2015-09-13 ENCOUNTER — Ambulatory Visit (INDEPENDENT_AMBULATORY_CARE_PROVIDER_SITE_OTHER): Payer: PPO | Admitting: Neurology

## 2015-09-13 ENCOUNTER — Encounter: Payer: Self-pay | Admitting: Neurology

## 2015-09-13 VITALS — BP 120/76 | HR 68 | Ht 59.5 in | Wt 131.5 lb

## 2015-09-13 DIAGNOSIS — G4486 Cervicogenic headache: Secondary | ICD-10-CM

## 2015-09-13 DIAGNOSIS — R51 Headache: Secondary | ICD-10-CM | POA: Diagnosis not present

## 2015-09-13 DIAGNOSIS — S161XXA Strain of muscle, fascia and tendon at neck level, initial encounter: Secondary | ICD-10-CM
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
## 2015-09-24 DIAGNOSIS — S83241D Other tear of medial meniscus, current injury, right knee, subsequent encounter: Secondary | ICD-10-CM | POA: Diagnosis not present

## 2015-09-24 DIAGNOSIS — S83281D Other tear of lateral meniscus, current injury, right knee, subsequent encounter: Secondary | ICD-10-CM | POA: Diagnosis not present

## 2015-09-25 ENCOUNTER — Ambulatory Visit (HOSPITAL_COMMUNITY): Payer: PPO | Admitting: Physical Therapy

## 2015-09-25 ENCOUNTER — Telehealth (HOSPITAL_COMMUNITY): Payer: Self-pay

## 2015-09-25 NOTE — Telephone Encounter (Signed)
Patient 's MD decided that therapy is not needed.

## 2015-09-26 ENCOUNTER — Ambulatory Visit (HOSPITAL_COMMUNITY): Payer: PPO | Admitting: Physical Therapy

## 2015-10-21 DIAGNOSIS — M2662 Arthralgia of temporomandibular joint: Secondary | ICD-10-CM | POA: Diagnosis not present

## 2015-10-21 DIAGNOSIS — M2663 Articular disc disorder of temporomandibular joint: Secondary | ICD-10-CM | POA: Diagnosis not present

## 2015-11-27 DIAGNOSIS — L570 Actinic keratosis: Secondary | ICD-10-CM | POA: Diagnosis not present

## 2015-11-27 DIAGNOSIS — D225 Melanocytic nevi of trunk: Secondary | ICD-10-CM | POA: Diagnosis not present

## 2015-11-27 DIAGNOSIS — X32XXXD Exposure to sunlight, subsequent encounter: Secondary | ICD-10-CM | POA: Diagnosis not present

## 2015-12-10 ENCOUNTER — Other Ambulatory Visit: Payer: Self-pay

## 2015-12-10 ENCOUNTER — Telehealth: Payer: Self-pay | Admitting: Internal Medicine

## 2015-12-10 MED ORDER — AMOXICILLIN-POT CLAVULANATE 875-125 MG PO TABS: 1.0000 | ORAL_TABLET | Freq: Two times a day (BID) | ORAL | 0 refills | Status: DC

## 2015-12-10 NOTE — Telephone Encounter (Signed)
Pt states she has a history of diverticulitis and reports she is having a flare and requesting antibiotics. States it started on Sunday and she is having LLQ abd pain, it is painful to walk and cough. She is having chills and cold sweats and at times the pain goes around to her back. She reports if she has a fever it is a low grade one. Please advise.

## 2015-12-10 NOTE — Telephone Encounter (Signed)
1. Liquid and low residue diet 2. Prescribe Augmentin 875 mg twice daily for 10 days 3. Have her see advanced practitioner in the office this week. My office is full 4. Worsening pain or high fevers (greater than 101) at any point prior to being seen, go to the ER

## 2015-12-10 NOTE — Telephone Encounter (Signed)
Pt aware, script sent to pharmacy and pt scheduled to see Amy Esterwood PA 12/12/15@2 :30pm.

## 2015-12-12 ENCOUNTER — Encounter: Payer: Self-pay | Admitting: Physician Assistant

## 2015-12-12 ENCOUNTER — Ambulatory Visit (INDEPENDENT_AMBULATORY_CARE_PROVIDER_SITE_OTHER): Payer: PPO | Admitting: Physician Assistant

## 2015-12-12 VITALS — BP 102/66 | HR 88 | Ht 59.0 in | Wt 131.2 lb

## 2015-12-12 DIAGNOSIS — K5792 Diverticulitis of intestine, part unspecified, without perforation or abscess without bleeding: Secondary | ICD-10-CM

## 2015-12-12 MED ORDER — AMOXICILLIN-POT CLAVULANATE 875-125 MG PO TABS: 1.0000 | ORAL_TABLET | Freq: Two times a day (BID) | ORAL | 0 refills | Status: AC

## 2015-12-12 NOTE — Patient Instructions (Addendum)
We sent a prescription to [facility] for the Augmentin 875 mg  Call for any problems.  Call if your symptoms do not resolve when you finish antibiotics.

## 2015-12-12 NOTE — Progress Notes (Signed)
Subjective:    Patient ID: Shelley Petersen, female    DOB: November 04, 1950, 65 y.o.   MRN: [id number]  HPI Shelley Petersen is a pleasant 65 year old white female known to Dr. Marina Petersen. She has history of obstructive sleep apnea, GERD, IBS and recurrent diverticulitis. She comes in today with complaints of left lower quadrant pain very consistent with her previous episodes of diverticulitis. She had called the office earlier in the week and has already started on Augmentin 875 mg by mouth twice a day. She is still uncomfortable with increasing pain with walking bending etc. but admits she feels a little bit better than she did 2 days ago. Not had any documented fever has had some sweats off and on. She is able to eat and has been having bowel movements. She has not noted any melena or hematochezia. She describes her pain as a constant ache in her left lower abdomen. She has no urinary symptoms. Colonoscopy was done in 2008 with finding of hyperplastic polyps and marked sigmoid diverticulosis. Last EGD 2009 with hiatal hernia and an incidental stricture not dilated as asymptomatic.  Review of Systems Pertinent positive and negative review of systems were noted in the above HPI section.  All other review of systems was otherwise negative.  Outpatient Encounter Prescriptions as of 12/12/2015  Medication Sig  . acetaminophen (TYLENOL) 500 MG tablet Take 500 mg by mouth daily as needed for headache.  Marland Kitchen. aspirin 81 MG tablet Take 81 mg by mouth daily.  . Cholecalciferol (VITAMIN D-3 PO) Take 1 tablet by mouth daily.  . hydrochlorothiazide (HYDRODIURIL) 25 MG tablet Take 25 mg by mouth every other day.  . lisinopril (PRINIVIL,ZESTRIL) 10 MG tablet Take 10 mg by mouth every other day.  Marland Kitchen. omeprazole (PRILOSEC OTC) 20 MG tablet Take 20 mg by mouth daily.  Marland Kitchen. UNABLE TO FIND Med Name: murpirocin oint  . Vitamin D-Vitamin K (VITAMIN K2-VITAMIN D3 PO) Take 1 tablet by mouth daily.  Marland Kitchen. amoxicillin-clavulanate (AUGMENTIN) 875-125 MG  tablet Take 1 tablet by mouth 2 (two) times daily.  . [DISCONTINUED] amoxicillin-clavulanate (AUGMENTIN) 875-125 MG tablet Take 1 tablet by mouth 2 (two) times daily.  . [DISCONTINUED] nitrofurantoin, macrocrystal-monohydrate, (MACROBID) 100 MG capsule Take 100 mg by mouth as needed.   Facility-Administered Encounter Medications as of 12/12/2015  Medication  . gadopentetate dimeglumine (MAGNEVIST) injection 12 mL   Allergies  Allergen Reactions  . Acetaminophen-Codeine Nausea And Vomiting  . Azithromycin Other (See Comments)    unspecified  . Doxycycline Other (See Comments)    unspecified  . Oxycodone-Acetaminophen Nausea And Vomiting  . Tramadol Nausea And Vomiting   Patient Active Problem List   Diagnosis Date Noted  . OSA (obstructive sleep apnea) 05/20/2015  . Snoring 05/20/2015  . Nocturnal hypoxemia 05/20/2015  . Grief reaction 05/20/2015  . Cervical strain 10/11/2014  . Cervicogenic headache 10/11/2014  . Diarrhea 03/24/2013  . LLQ abdominal pain 12/26/2012  . Hx of diverticulitis of colon 12/26/2012  . DIVERTICULITIS-COLON 06/09/2010  . GERD 10/07/2007  . IRRITABLE BOWEL SYNDROME 10/07/2007  . HYPERTENSION 04/30/2007  . INTERNAL HEMORRHOIDS 04/30/2007  . HEMORRHOIDS, EXTERNAL 04/30/2007  . DIVERTICULAR DISEASE 04/30/2007  . RECTAL BLEEDING 04/30/2007  . HEPATIC CYST 04/30/2007  . URETEROLITHIASIS 04/30/2007  . ARTHRITIS 04/30/2007  . ABDOMINAL PAIN, RIGHT LOWER QUADRANT 04/30/2007  . EPIGASTRIC DISCOMFORT 04/30/2007  . COLONIC POLYPS, HYPERPLASTIC 04/01/2007   Social History   Social History  . Marital status: Married    Spouse name: Shelley Petersen  .  Number of children: 2  . Years of education: 12   Occupational History  . Not on file.   Social History Main Topics  . Smoking status: Never Smoker  . Smokeless tobacco: Never Used  . Alcohol use No  . Drug use: No  . Sexual activity: Not on file   Other Topics Concern  . Not on file   Social History  Narrative   Patient lives at home with her husband Shelley Petersen).   Education high school.   Patient is not working at this time.   Patient drinks about 4 glasses of tea daily.             [name] family history includes Breast cancer in her mother; Diabetes in her brother, father, and sister; Heart disease in her father; Hypertension in her brother, brother, brother, father, mother, sister, sister, and sister; Stomach cancer in her father.      Objective:    Vitals:   12/12/15 1413  BP: 102/66  Pulse: 88    Physical Exam  well-developed older white female in no acute distress, pleasant accompanied by her husband- blood pressure 102/66 pulse 88, I 26.5. HEENT; nontraumatic normocephalic EOMI PERRLA sclera anicteric, Cardiovascular; regular rate and rhythm with S1-S2 no murmur or gallop, Pulmonary; clear bilaterally, Abdomen ;soft, bowel sounds are present she is tender in the left mid and left lower quadrant guarding no rebound no palpable mass or hepatosplenomegaly, Rectal ;exam not done, Ext; no clubbing cyanosis or edema skin warm and dry, Neuropsych ;mood and affect appropriate       Assessment & Plan:   #72 76 -year-old white female with recurrent left-sided diverticulitis. Patient has started Augmentin and is slowly improving. #2 sleep apnea #3 chronic GERD #4  IBS   #5 hyperplastic polyps last colonoscopy 2008  Plan; Continue Augmentin 875 mg by mouth twice a day. Patient will complete at least a 10 day course, have advised her that if symptoms have not completely resolved when she finishes 10 days of antibiotics to complete a 14 day course. Have called in an additional few days Tylenol as needed for pain She is eating a very soft bland diet and we'll gradually advance Patient and husband aware to call should her symptoms worsen at any point and she also knows to call if she has not completely resolved when she completes her antibiotics. She will be due for follow-up  colonoscopy 2018   Shelley Ferguson PA-C 12/12/2015   Cc: Shelley Sites, MD

## 2015-12-13 NOTE — Progress Notes (Signed)
Agree 

## 2015-12-19 ENCOUNTER — Telehealth: Payer: Self-pay | Admitting: Physician Assistant

## 2015-12-19 NOTE — Telephone Encounter (Signed)
FYI:  Amy pt states she is much better

## 2015-12-20 NOTE — Telephone Encounter (Signed)
GREAT 

## 2016-01-07 DIAGNOSIS — Z6826 Body mass index (BMI) 26.0-26.9, adult: Secondary | ICD-10-CM | POA: Diagnosis not present

## 2016-01-07 DIAGNOSIS — R070 Pain in throat: Secondary | ICD-10-CM | POA: Diagnosis not present

## 2016-01-07 DIAGNOSIS — Z1389 Encounter for screening for other disorder: Secondary | ICD-10-CM | POA: Diagnosis not present

## 2016-01-07 DIAGNOSIS — J343 Hypertrophy of nasal turbinates: Secondary | ICD-10-CM | POA: Diagnosis not present

## 2016-01-07 DIAGNOSIS — E663 Overweight: Secondary | ICD-10-CM | POA: Diagnosis not present

## 2016-01-07 DIAGNOSIS — J209 Acute bronchitis, unspecified: Secondary | ICD-10-CM | POA: Diagnosis not present

## 2016-01-07 DIAGNOSIS — J019 Acute sinusitis, unspecified: Secondary | ICD-10-CM | POA: Diagnosis not present

## 2016-01-10 DIAGNOSIS — Z23 Encounter for immunization: Secondary | ICD-10-CM | POA: Diagnosis not present

## 2016-01-17 ENCOUNTER — Other Ambulatory Visit (HOSPITAL_COMMUNITY): Payer: Self-pay | Admitting: Family Medicine

## 2016-01-17 DIAGNOSIS — Z1231 Encounter for screening mammogram for malignant neoplasm of breast: Secondary | ICD-10-CM

## 2016-02-11 ENCOUNTER — Ambulatory Visit (INDEPENDENT_AMBULATORY_CARE_PROVIDER_SITE_OTHER): Payer: PPO | Admitting: Urology

## 2016-02-11 DIAGNOSIS — N3281 Overactive bladder: Secondary | ICD-10-CM

## 2016-02-11 DIAGNOSIS — N952 Postmenopausal atrophic vaginitis: Secondary | ICD-10-CM

## 2016-02-12 ENCOUNTER — Ambulatory Visit (HOSPITAL_COMMUNITY)
Admission: RE | Admit: 2016-02-12 | Discharge: 2016-02-12 | Disposition: A | Discharge status: Home / Self Care | Payer: PPO | Source: Ambulatory Visit | Attending: Family Medicine | Admitting: Family Medicine

## 2016-02-12 DIAGNOSIS — Z1231 Encounter for screening mammogram for malignant neoplasm of breast: Secondary | ICD-10-CM | POA: Diagnosis not present

## 2016-03-17 DIAGNOSIS — S83241D Other tear of medial meniscus, current injury, right knee, subsequent encounter: Secondary | ICD-10-CM | POA: Diagnosis not present

## 2016-03-17 DIAGNOSIS — S83281D Other tear of lateral meniscus, current injury, right knee, subsequent encounter: Secondary | ICD-10-CM | POA: Diagnosis not present

## 2016-03-18 ENCOUNTER — Telehealth: Payer: Self-pay | Admitting: Neurology

## 2016-03-18 MED ORDER — NORTRIPTYLINE HCL 10 MG PO CAPS: ORAL_CAPSULE | ORAL | 3 refills | Status: DC

## 2016-03-18 NOTE — Telephone Encounter (Signed)
Called pt to schedule appt around the beginning of Feb for follow-up. Says that she will call back tomorrow to schedule when she has access to her calendar.

## 2016-03-18 NOTE — Telephone Encounter (Signed)
I called patient. The headaches are becoming a bit more frequent recently, still in the back of the head , they have become daily. The headaches are not severe, but they are persistent.    I will start the patient on low-dose nortriptyline, we will have her come back in for revisit sometime in the next 2 months.
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
## 2016-03-23 NOTE — Telephone Encounter (Signed)
Returned call to pt and appt scheduled for Mon 05/18/16.

## 2016-03-31 DIAGNOSIS — M25561 Pain in right knee: Secondary | ICD-10-CM | POA: Diagnosis not present

## 2016-04-02 DIAGNOSIS — M25561 Pain in right knee: Secondary | ICD-10-CM | POA: Diagnosis not present

## 2016-04-20 ENCOUNTER — Telehealth (HOSPITAL_COMMUNITY): Payer: Self-pay | Admitting: Family Medicine

## 2016-04-20 NOTE — Telephone Encounter (Signed)
04/20/16 pt called to confirm her appt.  She said that she has jury duty this week but will let us know if she can't make the appt.

## 2016-04-27 ENCOUNTER — Ambulatory Visit (HOSPITAL_COMMUNITY): Payer: PPO | Attending: Orthopedic Surgery

## 2016-04-27 DIAGNOSIS — M25561 Pain in right knee: Secondary | ICD-10-CM | POA: Diagnosis not present

## 2016-04-27 DIAGNOSIS — S83241D Other tear of medial meniscus, current injury, right knee, subsequent encounter: Secondary | ICD-10-CM | POA: Diagnosis not present

## 2016-04-27 DIAGNOSIS — M6281 Muscle weakness (generalized): Secondary | ICD-10-CM | POA: Insufficient documentation
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
## 2016-04-30 ENCOUNTER — Encounter (HOSPITAL_COMMUNITY): Payer: PPO

## 2016-05-04 ENCOUNTER — Ambulatory Visit (HOSPITAL_COMMUNITY): Payer: PPO

## 2016-05-04 ENCOUNTER — Telehealth (HOSPITAL_COMMUNITY): Payer: Self-pay | Admitting: Family Medicine

## 2016-05-04 NOTE — Telephone Encounter (Signed)
05/04/16  Called to reschedule since therapist out sick.  appt was rescheduled for 1/23

## 2016-05-05 ENCOUNTER — Ambulatory Visit (HOSPITAL_COMMUNITY): Payer: PPO

## 2016-05-05 DIAGNOSIS — M6281 Muscle weakness (generalized): Secondary | ICD-10-CM

## 2016-05-05 DIAGNOSIS — M25561 Pain in right knee: Secondary | ICD-10-CM

## 2016-05-05 NOTE — Addendum Note (Signed)
Addended by: Etta Grandchild on: 05/05/2016 01:37 PM   Modules accepted: Orders
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
## 2016-05-07 ENCOUNTER — Ambulatory Visit (HOSPITAL_COMMUNITY): Payer: PPO

## 2016-05-07 DIAGNOSIS — M6281 Muscle weakness (generalized): Secondary | ICD-10-CM

## 2016-05-07 DIAGNOSIS — M25561 Pain in right knee: Secondary | ICD-10-CM | POA: Diagnosis not present
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
## 2016-05-11 ENCOUNTER — Ambulatory Visit (HOSPITAL_COMMUNITY): Payer: PPO | Admitting: Physical Therapy

## 2016-05-11 ENCOUNTER — Encounter (HOSPITAL_COMMUNITY): Payer: Self-pay | Admitting: Physical Therapy

## 2016-05-11 DIAGNOSIS — M25561 Pain in right knee: Secondary | ICD-10-CM

## 2016-05-11 DIAGNOSIS — M6281 Muscle weakness (generalized): Secondary | ICD-10-CM
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
## 2016-05-14 ENCOUNTER — Ambulatory Visit (HOSPITAL_COMMUNITY): Payer: PPO | Attending: Orthopedic Surgery | Admitting: Physical Therapy

## 2016-05-14 DIAGNOSIS — M25561 Pain in right knee: Secondary | ICD-10-CM | POA: Insufficient documentation

## 2016-05-14 DIAGNOSIS — M6281 Muscle weakness (generalized): Secondary | ICD-10-CM | POA: Insufficient documentation

## 2016-05-14 NOTE — Patient Instructions (Addendum)
Functional Quadriceps: Chair Squat    Keeping feet flat on floor, shoulder width apart, squat as low as is comfortable. Use support as necessary. Repeat __15__ times per set. Do _1___ sets per session. Do __2__ sessions per day.  http://orth.exer.us/736   Copyright  VHI. All rights reserved.  Heel Raise: Bilateral (Standing)    Rise on balls of feet. Repeat _15___ times per set. Do ___1_ sets per session. Do __2__ sessions per day.  http://orth.exer.us/38   Copyright  VHI. All rights reserved.  Step-Down / Step-Up    Stand on stair step or _4-6___ inch stool. Slowly bend right leg, lowering other foot to floor. Return by straightening front leg. Repeat __15__ times per set. Do __1__ sets per session. Do 2____ sessions per day.  http://orth.exer.us/684   Copyright  VHI. All rights reserved.  Balance: Unilateral    Attempt to balance on right leg, eyes open. Hold __30__ seconds. Repeat __3__ times per set. Do __1__ sets per session. Do __3__ sessions per day. Perform exercise with eyes closed.  http://orth.exer.us/28   Copyright  VHI. All rights reserved.  Strengthening: Hip Extension (Prone)    Tighten muscles on front of right thigh, then lift leg __3__ inches from surface, keeping knee locked. Repeat __10__ times per set. Do _1___ sets per session. Do _2___ sessions per day.  http://orth.exer.us/620   Copyright  VHI. All rights reserved.
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
## 2016-05-18 ENCOUNTER — Ambulatory Visit (HOSPITAL_COMMUNITY): Payer: PPO | Admitting: Physical Therapy

## 2016-05-18 ENCOUNTER — Ambulatory Visit (INDEPENDENT_AMBULATORY_CARE_PROVIDER_SITE_OTHER): Payer: PPO | Admitting: Neurology

## 2016-05-18 ENCOUNTER — Encounter: Payer: Self-pay | Admitting: Neurology

## 2016-05-18 ENCOUNTER — Encounter (HOSPITAL_COMMUNITY): Payer: Self-pay | Admitting: Physical Therapy

## 2016-05-18 VITALS — BP 113/74 | HR 85 | Ht 59.0 in | Wt 138.0 lb

## 2016-05-18 DIAGNOSIS — R51 Headache: Secondary | ICD-10-CM | POA: Diagnosis not present

## 2016-05-18 DIAGNOSIS — M25561 Pain in right knee: Secondary | ICD-10-CM

## 2016-05-18 DIAGNOSIS — G4486 Cervicogenic headache: Secondary | ICD-10-CM

## 2016-05-18 DIAGNOSIS — S161XXA Strain of muscle, fascia and tendon at neck level, initial encounter: Secondary | ICD-10-CM | POA: Diagnosis not present

## 2016-05-18 DIAGNOSIS — M6281 Muscle weakness (generalized): Secondary | ICD-10-CM

## 2016-05-18 MED ORDER — GABAPENTIN 100 MG PO CAPS: 100.0000 mg | ORAL_CAPSULE | Freq: Three times a day (TID) | ORAL | 2 refills | Status: DC
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
## 2016-05-18 NOTE — Patient Instructions (Signed)
  With the gabapentin 100 mg capsules, begin one twice a day for 1 week, then go to one in the morning and two in the evening.  Mirapex (pramipexole) may result in confusion or hallucinations, dizziness, drowsiness, or nausea. If any significant side effects are noted, please contact our office.
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
## 2016-05-21 ENCOUNTER — Ambulatory Visit (HOSPITAL_COMMUNITY): Payer: PPO

## 2016-05-21 DIAGNOSIS — M25561 Pain in right knee: Secondary | ICD-10-CM | POA: Diagnosis not present

## 2016-05-21 DIAGNOSIS — M6281 Muscle weakness (generalized): Secondary | ICD-10-CM
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
## 2016-05-25 ENCOUNTER — Ambulatory Visit (HOSPITAL_COMMUNITY): Payer: PPO | Admitting: Physical Therapy

## 2016-05-25 ENCOUNTER — Encounter (HOSPITAL_COMMUNITY): Payer: Self-pay | Admitting: Physical Therapy

## 2016-05-25 DIAGNOSIS — M6281 Muscle weakness (generalized): Secondary | ICD-10-CM

## 2016-05-25 DIAGNOSIS — M25561 Pain in right knee: Secondary | ICD-10-CM
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
## 2016-05-28 ENCOUNTER — Ambulatory Visit (HOSPITAL_COMMUNITY): Payer: PPO

## 2016-05-28 DIAGNOSIS — M25561 Pain in right knee: Secondary | ICD-10-CM

## 2016-05-28 DIAGNOSIS — M6281 Muscle weakness (generalized): Secondary | ICD-10-CM
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
## 2016-06-01 ENCOUNTER — Ambulatory Visit (HOSPITAL_COMMUNITY): Payer: PPO

## 2016-06-01 DIAGNOSIS — L250 Unspecified contact dermatitis due to cosmetics: Secondary | ICD-10-CM | POA: Diagnosis not present

## 2016-06-01 DIAGNOSIS — S83281D Other tear of lateral meniscus, current injury, right knee, subsequent encounter: Secondary | ICD-10-CM | POA: Diagnosis not present

## 2016-06-01 DIAGNOSIS — M25561 Pain in right knee: Secondary | ICD-10-CM

## 2016-06-01 DIAGNOSIS — M6281 Muscle weakness (generalized): Secondary | ICD-10-CM
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
## 2016-06-04 ENCOUNTER — Ambulatory Visit (HOSPITAL_COMMUNITY): Payer: PPO

## 2016-06-05 DIAGNOSIS — H3561 Retinal hemorrhage, right eye: Secondary | ICD-10-CM | POA: Diagnosis not present

## 2016-06-05 DIAGNOSIS — H43813 Vitreous degeneration, bilateral: Secondary | ICD-10-CM | POA: Diagnosis not present

## 2016-06-17 DIAGNOSIS — M659 Synovitis and tenosynovitis, unspecified: Secondary | ICD-10-CM | POA: Diagnosis not present

## 2016-06-17 DIAGNOSIS — S83282A Other tear of lateral meniscus, current injury, left knee, initial encounter: Secondary | ICD-10-CM | POA: Diagnosis not present

## 2016-06-17 DIAGNOSIS — M94261 Chondromalacia, right knee: Secondary | ICD-10-CM | POA: Diagnosis not present

## 2016-06-17 DIAGNOSIS — M6751 Plica syndrome, right knee: Secondary | ICD-10-CM | POA: Diagnosis not present

## 2016-06-17 DIAGNOSIS — M23251 Derangement of posterior horn of lateral meniscus due to old tear or injury, right knee: Secondary | ICD-10-CM | POA: Diagnosis not present

## 2016-06-17 DIAGNOSIS — G8918 Other acute postprocedural pain: Secondary | ICD-10-CM | POA: Diagnosis not present

## 2016-06-17 DIAGNOSIS — S83001A Unspecified subluxation of right patella, initial encounter: Secondary | ICD-10-CM | POA: Diagnosis not present

## 2016-06-17 DIAGNOSIS — S83281A Other tear of lateral meniscus, current injury, right knee, initial encounter: Secondary | ICD-10-CM | POA: Diagnosis not present

## 2016-06-18 ENCOUNTER — Ambulatory Visit (INDEPENDENT_AMBULATORY_CARE_PROVIDER_SITE_OTHER): Payer: PPO | Admitting: Neurology

## 2016-06-18 ENCOUNTER — Telehealth: Payer: Self-pay | Admitting: Neurology

## 2016-06-18 ENCOUNTER — Encounter: Payer: Self-pay | Admitting: Neurology

## 2016-06-18 VITALS — BP 101/61 | HR 69

## 2016-06-18 DIAGNOSIS — Z9889 Other specified postprocedural states: Secondary | ICD-10-CM | POA: Diagnosis not present

## 2016-06-18 DIAGNOSIS — G4733 Obstructive sleep apnea (adult) (pediatric): Secondary | ICD-10-CM | POA: Diagnosis not present

## 2016-06-18 DIAGNOSIS — R0902 Hypoxemia: Secondary | ICD-10-CM

## 2016-06-18 DIAGNOSIS — G4734 Idiopathic sleep related nonobstructive alveolar hypoventilation: Secondary | ICD-10-CM

## 2016-06-18 NOTE — Telephone Encounter (Signed)
I called pt to offer her an appt today at 3:30pm or an appt on 06/23/2016 at 1:30pm. No answer, left a message asking her to call me back.
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
## 2016-06-18 NOTE — Telephone Encounter (Signed)
Patient calling back stating she can come in today for appointment at 3:30pm with Dr. Brett Fairy.

## 2016-06-18 NOTE — Telephone Encounter (Signed)
Pt has been placed on the schedule 

## 2016-06-18 NOTE — Telephone Encounter (Signed)
Pt called advised she needs to get CPAP. Said she had knee surgery, oxygen level was low during and after surgery and had to stay overnight. She is wanting an appt asap if possible.

## 2016-06-23 ENCOUNTER — Ambulatory Visit: Payer: PPO | Admitting: Adult Health

## 2016-06-23 DIAGNOSIS — S83241D Other tear of medial meniscus, current injury, right knee, subsequent encounter: Secondary | ICD-10-CM | POA: Diagnosis not present

## 2016-06-23 DIAGNOSIS — S83281D Other tear of lateral meniscus, current injury, right knee, subsequent encounter: Secondary | ICD-10-CM | POA: Diagnosis not present

## 2016-07-14 DIAGNOSIS — S83281D Other tear of lateral meniscus, current injury, right knee, subsequent encounter: Secondary | ICD-10-CM | POA: Diagnosis not present

## 2016-07-14 DIAGNOSIS — S83241D Other tear of medial meniscus, current injury, right knee, subsequent encounter: Secondary | ICD-10-CM | POA: Diagnosis not present

## 2016-08-03 ENCOUNTER — Ambulatory Visit (INDEPENDENT_AMBULATORY_CARE_PROVIDER_SITE_OTHER): Payer: PPO | Admitting: Neurology

## 2016-08-03 DIAGNOSIS — G4733 Obstructive sleep apnea (adult) (pediatric): Secondary | ICD-10-CM | POA: Diagnosis not present

## 2016-08-03 DIAGNOSIS — Z9889 Other specified postprocedural states: Principal | ICD-10-CM

## 2016-08-03 DIAGNOSIS — R0902 Hypoxemia: Secondary | ICD-10-CM

## 2016-08-03 DIAGNOSIS — G4734 Idiopathic sleep related nonobstructive alveolar hypoventilation: Secondary | ICD-10-CM

## 2016-08-11 DIAGNOSIS — Z1389 Encounter for screening for other disorder: Secondary | ICD-10-CM | POA: Diagnosis not present

## 2016-08-11 DIAGNOSIS — S83281D Other tear of lateral meniscus, current injury, right knee, subsequent encounter: Secondary | ICD-10-CM | POA: Diagnosis not present

## 2016-08-11 DIAGNOSIS — Z6827 Body mass index (BMI) 27.0-27.9, adult: Secondary | ICD-10-CM | POA: Diagnosis not present

## 2016-08-11 DIAGNOSIS — E782 Mixed hyperlipidemia: Secondary | ICD-10-CM | POA: Diagnosis not present

## 2016-08-11 DIAGNOSIS — Z0000 Encounter for general adult medical examination without abnormal findings: Secondary | ICD-10-CM | POA: Diagnosis not present

## 2016-08-11 DIAGNOSIS — I10 Essential (primary) hypertension: Secondary | ICD-10-CM | POA: Diagnosis not present

## 2016-08-11 DIAGNOSIS — N3010 Interstitial cystitis (chronic) without hematuria: Secondary | ICD-10-CM | POA: Diagnosis not present

## 2016-08-11 DIAGNOSIS — S83241D Other tear of medial meniscus, current injury, right knee, subsequent encounter: Secondary | ICD-10-CM | POA: Diagnosis not present

## 2016-08-15 DIAGNOSIS — Z1211 Encounter for screening for malignant neoplasm of colon: Secondary | ICD-10-CM | POA: Diagnosis not present

## 2016-08-17 NOTE — Procedures (Signed)
PATIENT'S NAME:  Shelley Petersen, Shelley Petersen DOB:      [birth year]      MR#:    [id number]     DATE OF RECORDING: 08/03/2016 REFERRING M.D.:  Sharilyn Sites, MD, Lenor Coffin, MD  Study Performed:   CPAP  Titration HISTORY:  Mrs. Belleau returns for CPAP titration after a HST from  04-08-2017 confirmed the diagnosis of OSA  with a AHI of 6.9 , SpO2 nadir of  79% and  87 minutes in desaturation time.   Cervical strain, cervicogenic headache, diverticulitis, esophageal stricture, GERD, hemorrhoids, hiatal hernia, hypertension and neck pain  The patient endorsed the Epworth Sleepiness Scale at 6/24.   The patient's weight 126 pounds with a height of 60 (inches), resulting in a BMI of 25. kg/m2. The patient's neck circumference measured 14 inches.  CURRENT MEDICATIONS: Acetaminophen, Aspirin, Cholecalciferol, Gabapentin, Hydrochlorothiazide, Lisinopril, Nortriptyline, Omeprazole and Vitamin D  PROCEDURE:  This is a multichannel digital polysomnogram utilizing the SomnoStar 11.2 system.  Electrodes and sensors were applied and monitored per AASM Specifications.   EEG, EOG, Chin and Limb EMG, were sampled at 200 Hz.  ECG, Snore and Nasal Pressure, Thermal Airflow, Respiratory Effort, CPAP Flow and Pressure, Oximetry was sampled at 50 Hz. Digital video and audio were recorded.       CPAP was initiated at 5 cmH20 with heated humidity per AASM split night standards and pressure was advanced to 7cmH20 because of hypopneas, apneas and desaturations.  At a PAP pressure of 7 cmH20, there was a reduction of the AHI to 0 with improvement of the above symptoms of obstructive sleep apnea.  The nadir rose to 93% spO2.   Lights Out was at 21:22 and Lights On at 05:00. Total recording time (TRT) was 458 minutes, with a total sleep time (TST) of 265.5 minutes. The patient's sleep latency was 79.5 minutes with 15.5 minutes of wake time after sleep onset. REM latency was 111.5 minutes.  The sleep efficiency was poor at  58.0%.     SLEEP ARCHITECTURE: WASO (Wake after sleep onset)  was 97.5 minutes.  There were 15.5 minutes in Stage N1, 179.5 minutes Stage N2, 0 minutes Stage N3 and 70.5 minutes in Stage REM.  The percentage of Stage N1 was 5.8%, Stage N2 was 67.6%, Stage N3 was 0% and Stage R (REM sleep) was 26.6%.   RESPIRATORY ANALYSIS:  There were 0 respiratory events: 0 apneas and hypopneas . The patient also had 0 respiratory event related arousals (RERAs).     . The total APNEA/HYPOPNEA INDEX  (AHI) was 0.0 /hour and the total RESPIRATORY DISTURBANCE INDEX was 0.0 hour  0 events occurred in REM sleep and 0 events in NREM.  The patient spent 81 minutes of total sleep time in the supine position and 185 minutes in non-supine.  OXYGEN SATURATION & C02:  The baseline 02 saturation was 94%, with the lowest being 90%. Time spent below 89% saturation equaled 0 minutes.  PERIODIC LIMB MOVEMENTS:    The patient had a total of 32 Periodic Limb Movements. The Periodic Limb Movement (PLM) index was 7.2 and the PLM Arousal index was 1.6 /hour. The arousals were noted as: 23 were spontaneous, 7 were associated with PLMs, and 0 were associated with respiratory events.  Audio and video analysis did not show any abnormal or unusual movements, behaviors, phonations or vocalizations.  The patient wore a mouth guard during titration.  EKG was in keeping with normal sinus rhythm (NSR)  The patient was  fitted with a Resmed AirFit P10 (XS) apparatus.  DIAGNOSIS 1. Mild Obstructive Sleep Apnea  2. Reduced sleep efficiency.   PLANS/RECOMMENDATIONS:  Use CPAP for 60-90 before returning for a follow up visit. The Epworth score and CPAP download will be obtained. CPAP compliance constitutes use of the device for 4 hours or more each day.    Larey Seat, MD  08-17-2016  Diplomat of the  American Board of Sleep Medicine Medical Director of [facility] at [facility]

## 2016-08-17 NOTE — Addendum Note (Signed)
Addended by: Larey Seat on: 08/17/2016 05:28 PM   Modules accepted: Orders

## 2016-08-18 ENCOUNTER — Telehealth: Payer: Self-pay

## 2016-08-18 ENCOUNTER — Encounter: Payer: Self-pay | Admitting: Adult Health

## 2016-08-18 ENCOUNTER — Ambulatory Visit (INDEPENDENT_AMBULATORY_CARE_PROVIDER_SITE_OTHER): Payer: PPO | Admitting: Adult Health

## 2016-08-18 ENCOUNTER — Ambulatory Visit: Payer: PPO | Admitting: Adult Health

## 2016-08-18 VITALS — BP 118/84 | HR 60 | Ht 59.0 in | Wt 135.0 lb

## 2016-08-18 DIAGNOSIS — G4733 Obstructive sleep apnea (adult) (pediatric): Secondary | ICD-10-CM | POA: Diagnosis not present

## 2016-08-18 DIAGNOSIS — G4486 Cervicogenic headache: Secondary | ICD-10-CM

## 2016-08-18 DIAGNOSIS — R51 Headache: Secondary | ICD-10-CM | POA: Diagnosis not present

## 2016-08-18 DIAGNOSIS — Z9989 Dependence on other enabling machines and devices: Secondary | ICD-10-CM
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
## 2016-08-18 NOTE — Patient Instructions (Signed)
Continue gabapentin 100 mg three times a day Start CPAP therapy If your symptoms worsen or you develop new symptoms please let us know.

## 2016-08-18 NOTE — Telephone Encounter (Signed)
-----   Message from Larey Seat, MD sent at 08/17/2016  5:28 PM EDT ----- CPAP ordered at [facility]

## 2016-08-18 NOTE — Progress Notes (Signed)
I have read the note, and I agree with the clinical assessment and plan.  Tocarra Gassen KEITH   

## 2016-08-18 NOTE — Telephone Encounter (Signed)
Pt arrived for her appt with Jinny Blossom, NP today and asked to speak to me regarding her sleep study results. I advised pt that Dr. Brett Fairy reviewed their sleep study results and found that pt did very well with the cpap. Dr. Brett Fairy recommends that pt start a cpap at home. Dr. Brett Fairy did mention that if cpap is not successful or tolerated, an ENT evaluation or dental device could be considered. Pt says that she does not have any tonsils and already uses a dental device, so neither of these 2 options are appropriate at this time. I reviewed PAP compliance expectations with the pt. Pt is agreeable to starting a CPAP. I advised pt that an order will be sent to a DME, [facility], and [facility] will call the pt within about one week after they file with the pt's insurance. [facility] will show the pt how to use the machine, fit for masks, and troubleshoot the CPAP if needed. A follow up appt was made for insurance purposes with Dr. Brett Fairy on August 15th, 2018 at 9:30am. Pt verbalized understanding to arrive 15 minutes early and bring their CPAP. A letter with all of this information in it will be mailed to the pt as a reminder. I verified with the pt that the address we have on file is correct. Pt verbalized understanding of results. Pt had no questions at this time but was encouraged to call back if questions arise.
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
## 2016-08-19 DIAGNOSIS — G4733 Obstructive sleep apnea (adult) (pediatric): Secondary | ICD-10-CM | POA: Diagnosis not present

## 2016-08-25 ENCOUNTER — Other Ambulatory Visit (HOSPITAL_COMMUNITY): Payer: Self-pay | Admitting: Family Medicine

## 2016-08-25 DIAGNOSIS — Z87891 Personal history of nicotine dependence: Secondary | ICD-10-CM

## 2016-08-27 ENCOUNTER — Telehealth: Payer: Self-pay | Admitting: Neurology

## 2016-08-27 NOTE — Telephone Encounter (Signed)
Patient called office in reference to appointment 11/25/16 with Dr. Brett Fairy patient started her CPAP machine on 08/19/16 and will out of her 90 day follow up window.  Are we able to have patient be seen sooner?  Please call

## 2016-08-27 NOTE — Telephone Encounter (Signed)
I called pt. I advised her that the NP can see the pt for a new cpap follow up. An appt was made with Jinny Blossom, NP for 10/28/2016 at 7:30am. Appt with Dr. Brett Fairy in August was cancelled. Pt verbalized understanding of new appt date and time.

## 2016-08-28 ENCOUNTER — Other Ambulatory Visit (HOSPITAL_COMMUNITY): Payer: Self-pay | Admitting: Family Medicine

## 2016-08-28 DIAGNOSIS — Z87891 Personal history of nicotine dependence: Secondary | ICD-10-CM

## 2016-08-28 DIAGNOSIS — Z122 Encounter for screening for malignant neoplasm of respiratory organs: Secondary | ICD-10-CM

## 2016-08-31 DIAGNOSIS — X32XXXD Exposure to sunlight, subsequent encounter: Secondary | ICD-10-CM | POA: Diagnosis not present

## 2016-08-31 DIAGNOSIS — L570 Actinic keratosis: Secondary | ICD-10-CM | POA: Diagnosis not present

## 2016-08-31 DIAGNOSIS — B078 Other viral warts: Secondary | ICD-10-CM | POA: Diagnosis not present

## 2016-08-31 DIAGNOSIS — L308 Other specified dermatitis: Secondary | ICD-10-CM | POA: Diagnosis not present

## 2016-09-08 DIAGNOSIS — M7651 Patellar tendinitis, right knee: Secondary | ICD-10-CM | POA: Diagnosis not present

## 2016-09-08 DIAGNOSIS — K7689 Other specified diseases of liver: Secondary | ICD-10-CM | POA: Diagnosis not present

## 2016-09-19 DIAGNOSIS — G4733 Obstructive sleep apnea (adult) (pediatric): Secondary | ICD-10-CM | POA: Diagnosis not present

## 2016-09-21 ENCOUNTER — Ambulatory Visit (HOSPITAL_COMMUNITY)
Admission: RE | Admit: 2016-09-21 | Discharge: 2016-09-21 | Disposition: A | Discharge status: Home / Self Care | Payer: PPO | Source: Ambulatory Visit | Attending: Family Medicine | Admitting: Family Medicine

## 2016-09-21 DIAGNOSIS — Z122 Encounter for screening for malignant neoplasm of respiratory organs: Secondary | ICD-10-CM | POA: Insufficient documentation

## 2016-09-21 DIAGNOSIS — Z87891 Personal history of nicotine dependence: Secondary | ICD-10-CM | POA: Insufficient documentation

## 2016-09-21 DIAGNOSIS — R918 Other nonspecific abnormal finding of lung field: Secondary | ICD-10-CM | POA: Insufficient documentation

## 2016-10-19 DIAGNOSIS — G4733 Obstructive sleep apnea (adult) (pediatric): Secondary | ICD-10-CM | POA: Diagnosis not present

## 2016-10-20 DIAGNOSIS — M7651 Patellar tendinitis, right knee: Secondary | ICD-10-CM | POA: Diagnosis not present

## 2016-10-27 ENCOUNTER — Encounter: Payer: Self-pay | Admitting: Adult Health

## 2016-10-28 ENCOUNTER — Ambulatory Visit (INDEPENDENT_AMBULATORY_CARE_PROVIDER_SITE_OTHER): Payer: PPO | Admitting: Adult Health

## 2016-10-28 ENCOUNTER — Encounter: Payer: Self-pay | Admitting: Adult Health

## 2016-10-28 VITALS — BP 117/78 | HR 63 | Ht 59.5 in | Wt 136.0 lb

## 2016-10-28 DIAGNOSIS — Z9989 Dependence on other enabling machines and devices: Secondary | ICD-10-CM | POA: Diagnosis not present

## 2016-10-28 DIAGNOSIS — R51 Headache: Secondary | ICD-10-CM

## 2016-10-28 DIAGNOSIS — G4733 Obstructive sleep apnea (adult) (pediatric): Secondary | ICD-10-CM | POA: Diagnosis not present

## 2016-10-28 DIAGNOSIS — G4486 Cervicogenic headache: Secondary | ICD-10-CM

## 2016-10-28 NOTE — Addendum Note (Signed)
Addended by: Trudie Buckler on: 10/28/2016 08:21 AM   Modules accepted: Orders
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
## 2016-10-28 NOTE — Patient Instructions (Signed)
Your Plan:  Continue using CPAP nightly  Continue gabapentin If your symptoms worsen or you develop new symptoms please let us know.   Thank you for coming to see us at [facility]. I hope we have been able to provide you high quality care today.  You may receive a patient satisfaction survey over the next few weeks. We would appreciate your feedback and comments so that we may continue to improve ourselves and the health of our patients.  

## 2016-11-19 DIAGNOSIS — G4733 Obstructive sleep apnea (adult) (pediatric): Secondary | ICD-10-CM | POA: Diagnosis not present

## 2016-11-25 ENCOUNTER — Ambulatory Visit: Payer: Self-pay | Admitting: Neurology

## 2016-12-15 DIAGNOSIS — Z6827 Body mass index (BMI) 27.0-27.9, adult: Secondary | ICD-10-CM | POA: Diagnosis not present

## 2016-12-15 DIAGNOSIS — E663 Overweight: Secondary | ICD-10-CM | POA: Diagnosis not present

## 2016-12-15 DIAGNOSIS — T464X5A Adverse effect of angiotensin-converting-enzyme inhibitors, initial encounter: Secondary | ICD-10-CM | POA: Diagnosis not present

## 2016-12-20 DIAGNOSIS — G4733 Obstructive sleep apnea (adult) (pediatric): Secondary | ICD-10-CM | POA: Diagnosis not present

## 2016-12-21 DIAGNOSIS — X32XXXD Exposure to sunlight, subsequent encounter: Secondary | ICD-10-CM | POA: Diagnosis not present

## 2016-12-21 DIAGNOSIS — D225 Melanocytic nevi of trunk: Secondary | ICD-10-CM | POA: Diagnosis not present

## 2016-12-21 DIAGNOSIS — L821 Other seborrheic keratosis: Secondary | ICD-10-CM | POA: Diagnosis not present

## 2016-12-21 DIAGNOSIS — L570 Actinic keratosis: Secondary | ICD-10-CM | POA: Diagnosis not present

## 2016-12-21 DIAGNOSIS — D0471 Carcinoma in situ of skin of right lower limb, including hip: Secondary | ICD-10-CM | POA: Diagnosis not present

## 2016-12-21 DIAGNOSIS — Z1283 Encounter for screening for malignant neoplasm of skin: Secondary | ICD-10-CM | POA: Diagnosis not present

## 2016-12-28 ENCOUNTER — Other Ambulatory Visit (HOSPITAL_COMMUNITY): Payer: Self-pay | Admitting: Family Medicine

## 2016-12-28 DIAGNOSIS — Z1231 Encounter for screening mammogram for malignant neoplasm of breast: Secondary | ICD-10-CM

## 2017-01-19 DIAGNOSIS — G4733 Obstructive sleep apnea (adult) (pediatric): Secondary | ICD-10-CM | POA: Diagnosis not present

## 2017-01-19 DIAGNOSIS — Z23 Encounter for immunization: Secondary | ICD-10-CM | POA: Diagnosis not present

## 2017-02-15 ENCOUNTER — Encounter (HOSPITAL_COMMUNITY): Payer: Self-pay

## 2017-02-15 ENCOUNTER — Ambulatory Visit (HOSPITAL_COMMUNITY)
Admission: RE | Admit: 2017-02-15 | Discharge: 2017-02-15 | Disposition: A | Discharge status: Home / Self Care | Payer: PPO | Source: Ambulatory Visit | Attending: Family Medicine | Admitting: Family Medicine

## 2017-02-15 DIAGNOSIS — Z1231 Encounter for screening mammogram for malignant neoplasm of breast: Secondary | ICD-10-CM | POA: Insufficient documentation

## 2017-02-17 ENCOUNTER — Encounter: Payer: Self-pay | Admitting: Adult Health

## 2017-02-18 ENCOUNTER — Ambulatory Visit: Payer: PPO | Admitting: Adult Health

## 2017-02-18 ENCOUNTER — Encounter: Payer: Self-pay | Admitting: Adult Health

## 2017-02-18 VITALS — BP 116/71 | HR 61 | Wt 137.2 lb

## 2017-02-18 DIAGNOSIS — G4733 Obstructive sleep apnea (adult) (pediatric): Secondary | ICD-10-CM | POA: Diagnosis not present

## 2017-02-18 DIAGNOSIS — Z9989 Dependence on other enabling machines and devices: Secondary | ICD-10-CM

## 2017-02-18 DIAGNOSIS — R51 Headache: Secondary | ICD-10-CM | POA: Diagnosis not present

## 2017-02-18 DIAGNOSIS — G4486 Cervicogenic headache: Secondary | ICD-10-CM
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
## 2017-02-18 NOTE — Patient Instructions (Signed)
Your Plan:  Continue using CPAP nightly  Order sent for new supplies If your symptoms worsen or you develop new symptoms please let us know.   Thank you for coming to see [place] at [facility]. I hope we have been able to provide you high quality care today.  You may receive a patient satisfaction survey over the next few weeks. We would appreciate your feedback and comments so that we may continue to improve ourselves and the health of our patients.

## 2017-02-19 DIAGNOSIS — G4733 Obstructive sleep apnea (adult) (pediatric): Secondary | ICD-10-CM | POA: Diagnosis not present

## 2017-02-22 DIAGNOSIS — G4733 Obstructive sleep apnea (adult) (pediatric): Secondary | ICD-10-CM | POA: Diagnosis not present

## 2017-02-26 DIAGNOSIS — G4733 Obstructive sleep apnea (adult) (pediatric): Secondary | ICD-10-CM | POA: Diagnosis not present

## 2017-03-21 DIAGNOSIS — G4733 Obstructive sleep apnea (adult) (pediatric): Secondary | ICD-10-CM | POA: Diagnosis not present

## 2017-04-16 ENCOUNTER — Encounter: Payer: Self-pay | Admitting: Internal Medicine

## 2017-04-21 DIAGNOSIS — G4733 Obstructive sleep apnea (adult) (pediatric): Secondary | ICD-10-CM | POA: Diagnosis not present

## 2017-04-22 ENCOUNTER — Encounter: Payer: Self-pay | Admitting: Internal Medicine

## 2017-05-21 ENCOUNTER — Telehealth: Payer: Self-pay | Admitting: *Deleted

## 2017-05-21 ENCOUNTER — Ambulatory Visit (AMBULATORY_SURGERY_CENTER): Payer: Self-pay | Admitting: *Deleted

## 2017-05-21 ENCOUNTER — Other Ambulatory Visit: Payer: Self-pay

## 2017-05-21 ENCOUNTER — Encounter: Payer: Self-pay | Admitting: Internal Medicine

## 2017-05-21 VITALS — Ht 59.5 in | Wt 136.6 lb

## 2017-05-21 DIAGNOSIS — Z1211 Encounter for screening for malignant neoplasm of colon: Secondary | ICD-10-CM

## 2017-05-21 MED ORDER — NA SULFATE-K SULFATE-MG SULF 17.5-3.13-1.6 GM/177ML PO SOLN: 1.0000 [IU] | Freq: Once | ORAL | 0 refills | Status: AC

## 2017-05-21 NOTE — Telephone Encounter (Signed)
Okay to schedule in the [place]. Different sedation. Does not need office visit. Thanks for checking

## 2017-05-21 NOTE — Progress Notes (Signed)
No egg or soy allergy known to patient  Pt. Had to be place on CPAP in March 2018 during knee surgery.  O2 sats dropped. Hypoxemia recommended to have CPAP during all procedures. No diet pills per patient No home 02 use per patient  No blood thinners per patient  Pt denies issues with constipation  No A fib or A flutter  EMMI video sent to pt's e mail pt. declined

## 2017-05-21 NOTE — Telephone Encounter (Signed)
Dr. Henrene Pastor,   I saw this patient in pre visit today and she states that she had an episode of prolonged and severe Hypoxemia in March of 2018 during her knee surgery. She had to be placed on a CPAP emergently and was admitted overnight.  She is scheduled for a 10 year recall colonoscopy on 06/04/17 here at [facility].  She has not been seen in the GI office since this Hypoxemia episode. Last  OV was  With Nicoletta Ba on 12/12/15.    Please advise.    Thank you.  Elizabeth Palau, CMA  PV

## 2017-05-22 DIAGNOSIS — G4733 Obstructive sleep apnea (adult) (pediatric): Secondary | ICD-10-CM | POA: Diagnosis not present

## 2017-05-24 NOTE — Telephone Encounter (Signed)
Called patient and informed her that it was cleared by Dr. Henrene Pastor and Osvaldo Angst to be done here at [facility].  She then stated that she could not afford the Suprep as it was going to be $90.00.  I told her I could give Plenvu sample and I would leave at front desk on 4th floor with new printed instructions.  She was pleased with this.  Sample given.  Lot # [id number] Exp. 09/2018

## 2017-05-24 NOTE — Telephone Encounter (Signed)
Shelley Petersen,  Just wanted you to be aware.  Dr. Henrene Pastor said it was okay to do in [place].  Is this okay with you?    B. Adianna Darwin, CMA

## 2017-05-24 NOTE — Telephone Encounter (Signed)
Shelley Petersen,  This pt is cleared for anesthetic care at [facility].  Thanks,  Shalyn Koral 

## 2017-06-04 ENCOUNTER — Encounter: Payer: Self-pay | Admitting: Internal Medicine

## 2017-06-04 ENCOUNTER — Other Ambulatory Visit: Payer: Self-pay

## 2017-06-04 ENCOUNTER — Ambulatory Visit (AMBULATORY_SURGERY_CENTER): Payer: PPO | Admitting: Internal Medicine

## 2017-06-04 VITALS — BP 91/60 | HR 71 | Temp 96.6°F | Resp 11 | Ht 59.5 in | Wt 136.0 lb

## 2017-06-04 DIAGNOSIS — Z1211 Encounter for screening for malignant neoplasm of colon: Secondary | ICD-10-CM

## 2017-06-04 DIAGNOSIS — Z1212 Encounter for screening for malignant neoplasm of rectum: Secondary | ICD-10-CM | POA: Diagnosis not present

## 2017-06-04 DIAGNOSIS — G4733 Obstructive sleep apnea (adult) (pediatric): Secondary | ICD-10-CM | POA: Diagnosis not present

## 2017-06-04 DIAGNOSIS — Z86010 Personal history of colonic polyps: Secondary | ICD-10-CM | POA: Diagnosis not present

## 2017-06-04 DIAGNOSIS — I10 Essential (primary) hypertension: Secondary | ICD-10-CM | POA: Diagnosis not present

## 2017-06-04 MED ORDER — SODIUM CHLORIDE 0.9 % IV SOLN: 500.0000 mL | Freq: Once | INTRAVENOUS | Status: AC

## 2017-06-04 NOTE — Progress Notes (Signed)
To recovery, report to RN, VSS. 

## 2017-06-04 NOTE — Op Note (Signed)
[facility] Patient Name: Shelley Petersen Procedure Date: 06/04/2017 11:22 AM MRN: [id number] Endoscopist: Docia Chuck. Henrene Pastor , MD Age: 67 Referring MD:  Date of Birth: 01-Jul-1950 Gender: Female Account #: [id number] Procedure:                Colonoscopy Indications:              Screening for colorectal malignant neoplasm.                            Negative index exam December 2008 Medicines:                Monitored Anesthesia Care Procedure:                Pre-Anesthesia Assessment:                           - Prior to the procedure, a History and Physical                            was performed, and patient medications and                            allergies were reviewed. The patient's tolerance of                            previous anesthesia was also reviewed. The risks                            and benefits of the procedure and the sedation                            options and risks were discussed with the patient.                            All questions were answered, and informed consent                            was obtained. Prior Anticoagulants: The patient has                            taken no previous anticoagulant or antiplatelet                            agents. ASA Grade Assessment: II - A patient with                            mild systemic disease. After reviewing the risks                            and benefits, the patient was deemed in                            satisfactory condition to undergo the procedure.  After obtaining informed consent, the colonoscope                            was passed under direct vision. Throughout the                            procedure, the patient's blood pressure, pulse, and                            oxygen saturations were monitored continuously. The                            Colonoscope was introduced through the anus and                            advanced to the the cecum,  identified by                            appendiceal orifice and ileocecal valve. The                            ileocecal valve, appendiceal orifice, and rectum                            were photographed. The quality of the bowel                            preparation was excellent. The colonoscopy was                            performed without difficulty. The patient tolerated                            the procedure well. The bowel preparation used was                            SUPREP. Scope In: 11:33:34 AM Scope Out: 11:44:31 AM Scope Withdrawal Time: 0 hours 9 minutes 11 seconds  Total Procedure Duration: 0 hours 10 minutes 57 seconds  Findings:                 Multiple small and large-mouthed diverticula were                            found in the entire colon.                           The exam was otherwise without abnormality on                            direct and retroflexion views. Complications:            No immediate complications. Estimated blood loss:                            None. Estimated Blood  Loss:     Estimated blood loss: none. Impression:               - Diverticulosis in the entire examined colon.                           - The examination was otherwise normal on direct                            and retroflexion views.                           - No specimens collected. Recommendation:           - Repeat colonoscopy in 10 years for screening                            purposes.                           - Patient has a contact number available for                            emergencies. The signs and symptoms of potential                            delayed complications were discussed with the                            patient. Return to normal activities tomorrow.                            Written discharge instructions were provided to the                            patient.                           - Resume previous diet.                            - Continue present medications. Docia Chuck. Henrene Pastor, MD 06/04/2017 11:51:17 AM This report has been signed electronically.

## 2017-06-04 NOTE — Progress Notes (Signed)
Pt's states no medical or surgical changes since previsit or office visit. 

## 2017-06-04 NOTE — Patient Instructions (Signed)
**   Handouts given on diverticulosis **   YOU HAD AN ENDOSCOPIC PROCEDURE TODAY AT [place]:   Refer to the procedure report that was given to you for any specific questions about what was found during the examination.  If the procedure report does not answer your questions, please call your gastroenterologist to clarify.  If you requested that your care partner not be given the details of your procedure findings, then the procedure report has been included in a sealed envelope for you to review at your convenience later.  YOU SHOULD EXPECT: Some feelings of bloating in the abdomen. Passage of more gas than usual.  Walking can help get rid of the air that was put into your GI tract during the procedure and reduce the bloating. If you had a lower endoscopy (such as a colonoscopy or flexible sigmoidoscopy) you may notice spotting of blood in your stool or on the toilet paper. If you underwent a bowel prep for your procedure, you may not have a normal bowel movement for a few days.  Please Note:  You might notice some irritation and congestion in your nose or some drainage.  This is from the oxygen used during your procedure.  There is no need for concern and it should clear up in a day or so.  SYMPTOMS TO REPORT IMMEDIATELY:   Following lower endoscopy (colonoscopy or flexible sigmoidoscopy):  Excessive amounts of blood in the stool  Significant tenderness or worsening of abdominal pains  Swelling of the abdomen that is new, acute  Fever of 100F or higher  For urgent or emergent issues, a gastroenterologist can be reached at any hour by calling [phone].   DIET:  We do recommend a small meal at first, but then you may proceed to your regular diet.  Drink plenty of fluids but you should avoid alcoholic beverages for 24 hours.  ACTIVITY:  You should plan to take it easy for the rest of today and you should NOT DRIVE or use heavy machinery until tomorrow (because of the  sedation medicines used during the test).    FOLLOW UP: Our staff will call the number listed on your records the next business day following your procedure to check on you and address any questions or concerns that you may have regarding the information given to you following your procedure. If we do not reach you, we will leave a message.  However, if you are feeling well and you are not experiencing any problems, there is no need to return our call.  We will assume that you have returned to your regular daily activities without incident.  If any biopsies were taken you will be contacted by phone or by letter within the next 1-3 weeks.  Please call us at [phone] if you have not heard about the biopsies in 3 weeks.    SIGNATURES/CONFIDENTIALITY: You and/or your care partner have signed paperwork which will be entered into your electronic medical record.  These signatures attest to the fact that that the information above on your After Visit Summary has been reviewed and is understood.  Full responsibility of the confidentiality of this discharge information lies with you and/or your care-partner.

## 2017-06-07 ENCOUNTER — Telehealth: Payer: Self-pay | Admitting: *Deleted

## 2017-06-07 NOTE — Telephone Encounter (Signed)

## 2017-06-09 DIAGNOSIS — H5213 Myopia, bilateral: Secondary | ICD-10-CM | POA: Diagnosis not present

## 2017-06-09 DIAGNOSIS — H524 Presbyopia: Secondary | ICD-10-CM | POA: Diagnosis not present

## 2017-06-09 DIAGNOSIS — H43813 Vitreous degeneration, bilateral: Secondary | ICD-10-CM | POA: Diagnosis not present

## 2017-06-09 DIAGNOSIS — H52223 Regular astigmatism, bilateral: Secondary | ICD-10-CM | POA: Diagnosis not present

## 2017-06-19 DIAGNOSIS — G4733 Obstructive sleep apnea (adult) (pediatric): Secondary | ICD-10-CM | POA: Diagnosis not present

## 2017-07-14 DIAGNOSIS — M65311 Trigger thumb, right thumb: Secondary | ICD-10-CM | POA: Diagnosis not present

## 2017-07-20 DIAGNOSIS — G4733 Obstructive sleep apnea (adult) (pediatric): Secondary | ICD-10-CM | POA: Diagnosis not present

## 2017-08-16 ENCOUNTER — Encounter: Payer: Self-pay | Admitting: Neurology

## 2017-08-18 ENCOUNTER — Ambulatory Visit: Payer: PPO | Admitting: Adult Health

## 2017-08-18 ENCOUNTER — Encounter: Payer: Self-pay | Admitting: Adult Health

## 2017-08-18 VITALS — BP 123/76 | HR 70 | Ht 60.0 in | Wt 137.0 lb

## 2017-08-18 DIAGNOSIS — G4733 Obstructive sleep apnea (adult) (pediatric): Secondary | ICD-10-CM | POA: Diagnosis not present

## 2017-08-18 DIAGNOSIS — R51 Headache: Secondary | ICD-10-CM

## 2017-08-18 DIAGNOSIS — Z9989 Dependence on other enabling machines and devices: Secondary | ICD-10-CM | POA: Diagnosis not present

## 2017-08-18 DIAGNOSIS — G4486 Cervicogenic headache: Secondary | ICD-10-CM

## 2017-08-18 NOTE — Patient Instructions (Signed)
Your Plan:  Continue using CPAP nightly and >4 hours each night If your symptoms worsen or you develop new symptoms please let us know.   Thank you for coming to see us at [facility]. I hope we have been able to provide you high quality care today.  You may receive a patient satisfaction survey over the next few weeks. We would appreciate your feedback and comments so that we may continue to improve ourselves and the health of our patients.  
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
## 2017-08-19 DIAGNOSIS — G4733 Obstructive sleep apnea (adult) (pediatric): Secondary | ICD-10-CM | POA: Diagnosis not present

## 2017-08-23 DIAGNOSIS — G4733 Obstructive sleep apnea (adult) (pediatric): Secondary | ICD-10-CM | POA: Diagnosis not present

## 2017-08-31 DIAGNOSIS — E782 Mixed hyperlipidemia: Secondary | ICD-10-CM | POA: Diagnosis not present

## 2017-08-31 DIAGNOSIS — E559 Vitamin D deficiency, unspecified: Secondary | ICD-10-CM | POA: Diagnosis not present

## 2017-08-31 DIAGNOSIS — Z0001 Encounter for general adult medical examination with abnormal findings: Secondary | ICD-10-CM | POA: Diagnosis not present

## 2017-08-31 DIAGNOSIS — Z1389 Encounter for screening for other disorder: Secondary | ICD-10-CM | POA: Diagnosis not present

## 2017-08-31 DIAGNOSIS — R945 Abnormal results of liver function studies: Secondary | ICD-10-CM | POA: Diagnosis not present

## 2017-08-31 DIAGNOSIS — R300 Dysuria: Secondary | ICD-10-CM | POA: Diagnosis not present

## 2017-08-31 DIAGNOSIS — I10 Essential (primary) hypertension: Secondary | ICD-10-CM | POA: Diagnosis not present

## 2017-08-31 DIAGNOSIS — N3010 Interstitial cystitis (chronic) without hematuria: Secondary | ICD-10-CM | POA: Diagnosis not present

## 2017-08-31 DIAGNOSIS — E663 Overweight: Secondary | ICD-10-CM | POA: Diagnosis not present

## 2017-08-31 DIAGNOSIS — Z6827 Body mass index (BMI) 27.0-27.9, adult: Secondary | ICD-10-CM | POA: Diagnosis not present

## 2017-09-01 DIAGNOSIS — E782 Mixed hyperlipidemia: Secondary | ICD-10-CM | POA: Diagnosis not present

## 2017-09-22 DIAGNOSIS — H6121 Impacted cerumen, right ear: Secondary | ICD-10-CM | POA: Diagnosis not present

## 2017-09-22 DIAGNOSIS — H6122 Impacted cerumen, left ear: Secondary | ICD-10-CM | POA: Diagnosis not present

## 2017-09-22 DIAGNOSIS — R7309 Other abnormal glucose: Secondary | ICD-10-CM | POA: Diagnosis not present

## 2017-09-22 DIAGNOSIS — E663 Overweight: Secondary | ICD-10-CM | POA: Diagnosis not present

## 2017-09-22 DIAGNOSIS — E876 Hypokalemia: Secondary | ICD-10-CM | POA: Diagnosis not present

## 2017-09-22 DIAGNOSIS — Z6826 Body mass index (BMI) 26.0-26.9, adult: Secondary | ICD-10-CM | POA: Diagnosis not present

## 2017-12-03 DIAGNOSIS — G4733 Obstructive sleep apnea (adult) (pediatric): Secondary | ICD-10-CM | POA: Diagnosis not present

## 2017-12-28 DIAGNOSIS — Z1283 Encounter for screening for malignant neoplasm of skin: Secondary | ICD-10-CM | POA: Diagnosis not present

## 2017-12-28 DIAGNOSIS — C44722 Squamous cell carcinoma of skin of right lower limb, including hip: Secondary | ICD-10-CM | POA: Diagnosis not present

## 2017-12-28 DIAGNOSIS — Z85828 Personal history of other malignant neoplasm of skin: Secondary | ICD-10-CM | POA: Diagnosis not present

## 2017-12-28 DIAGNOSIS — Z08 Encounter for follow-up examination after completed treatment for malignant neoplasm: Secondary | ICD-10-CM | POA: Diagnosis not present

## 2017-12-28 DIAGNOSIS — D225 Melanocytic nevi of trunk: Secondary | ICD-10-CM | POA: Diagnosis not present

## 2018-01-14 ENCOUNTER — Other Ambulatory Visit (HOSPITAL_COMMUNITY): Payer: Self-pay | Admitting: Family Medicine

## 2018-01-14 DIAGNOSIS — Z1231 Encounter for screening mammogram for malignant neoplasm of breast: Secondary | ICD-10-CM

## 2018-02-01 ENCOUNTER — Ambulatory Visit: Payer: PPO | Admitting: Urology

## 2018-02-01 DIAGNOSIS — N952 Postmenopausal atrophic vaginitis: Secondary | ICD-10-CM

## 2018-02-01 DIAGNOSIS — Z23 Encounter for immunization: Secondary | ICD-10-CM | POA: Diagnosis not present

## 2018-02-01 DIAGNOSIS — N3281 Overactive bladder: Secondary | ICD-10-CM

## 2018-02-09 DIAGNOSIS — Z08 Encounter for follow-up examination after completed treatment for malignant neoplasm: Secondary | ICD-10-CM | POA: Diagnosis not present

## 2018-02-09 DIAGNOSIS — X32XXXD Exposure to sunlight, subsequent encounter: Secondary | ICD-10-CM | POA: Diagnosis not present

## 2018-02-09 DIAGNOSIS — L570 Actinic keratosis: Secondary | ICD-10-CM | POA: Diagnosis not present

## 2018-02-09 DIAGNOSIS — Z85828 Personal history of other malignant neoplasm of skin: Secondary | ICD-10-CM | POA: Diagnosis not present

## 2018-02-09 DIAGNOSIS — L820 Inflamed seborrheic keratosis: Secondary | ICD-10-CM | POA: Diagnosis not present

## 2018-02-17 ENCOUNTER — Ambulatory Visit (HOSPITAL_COMMUNITY)
Admission: RE | Admit: 2018-02-17 | Discharge: 2018-02-17 | Disposition: A | Discharge status: Home / Self Care | Payer: PPO | Source: Ambulatory Visit | Attending: Family Medicine | Admitting: Family Medicine

## 2018-02-17 ENCOUNTER — Encounter (HOSPITAL_COMMUNITY): Payer: Self-pay

## 2018-02-17 DIAGNOSIS — Z1231 Encounter for screening mammogram for malignant neoplasm of breast: Secondary | ICD-10-CM

## 2018-05-11 DIAGNOSIS — I10 Essential (primary) hypertension: Secondary | ICD-10-CM | POA: Diagnosis not present

## 2018-05-11 DIAGNOSIS — Z6824 Body mass index (BMI) 24.0-24.9, adult: Secondary | ICD-10-CM | POA: Diagnosis not present

## 2018-05-11 DIAGNOSIS — Z0001 Encounter for general adult medical examination with abnormal findings: Secondary | ICD-10-CM | POA: Diagnosis not present

## 2018-05-11 DIAGNOSIS — N3010 Interstitial cystitis (chronic) without hematuria: Secondary | ICD-10-CM | POA: Diagnosis not present

## 2018-05-11 DIAGNOSIS — R945 Abnormal results of liver function studies: Secondary | ICD-10-CM | POA: Diagnosis not present

## 2018-05-11 DIAGNOSIS — Z1389 Encounter for screening for other disorder: Secondary | ICD-10-CM | POA: Diagnosis not present

## 2018-05-11 DIAGNOSIS — R7309 Other abnormal glucose: Secondary | ICD-10-CM | POA: Diagnosis not present

## 2018-05-11 DIAGNOSIS — E7849 Other hyperlipidemia: Secondary | ICD-10-CM | POA: Diagnosis not present

## 2018-08-04 ENCOUNTER — Other Ambulatory Visit: Payer: Self-pay | Admitting: Neurology

## 2018-08-04 ENCOUNTER — Telehealth: Payer: Self-pay | Admitting: Neurology

## 2018-08-04 DIAGNOSIS — G4733 Obstructive sleep apnea (adult) (pediatric): Secondary | ICD-10-CM

## 2018-08-04 DIAGNOSIS — Z9989 Dependence on other enabling machines and devices: Principal | ICD-10-CM

## 2018-08-04 NOTE — Telephone Encounter (Signed)
Pt called in and stated she needs a new script for her CPAP supplies sent to [facility]

## 2018-08-04 NOTE — Telephone Encounter (Signed)
Order placed and sent for the patient to [facility]. Pt must keep upcoming apt in may

## 2018-08-05 DIAGNOSIS — G4733 Obstructive sleep apnea (adult) (pediatric): Secondary | ICD-10-CM | POA: Diagnosis not present

## 2018-08-10 DIAGNOSIS — D225 Melanocytic nevi of trunk: Secondary | ICD-10-CM | POA: Diagnosis not present

## 2018-08-10 DIAGNOSIS — L821 Other seborrheic keratosis: Secondary | ICD-10-CM | POA: Diagnosis not present

## 2018-08-10 DIAGNOSIS — Z85828 Personal history of other malignant neoplasm of skin: Secondary | ICD-10-CM | POA: Diagnosis not present

## 2018-08-10 DIAGNOSIS — Z08 Encounter for follow-up examination after completed treatment for malignant neoplasm: Secondary | ICD-10-CM | POA: Diagnosis not present

## 2018-08-10 DIAGNOSIS — L308 Other specified dermatitis: Secondary | ICD-10-CM | POA: Diagnosis not present

## 2018-08-17 ENCOUNTER — Telehealth: Payer: Self-pay

## 2018-08-17 NOTE — Telephone Encounter (Signed)
Spoke with the patient and she has given verbal consent to do a doxy.me visit and to file her insurance. E-mail, mobile number and carrier have been confirmed and sent.  E-mail: [email or web address] Text: [phone] Occupational hygienist)

## 2018-08-18 NOTE — Telephone Encounter (Signed)
Chart updated.  She received doxy email.

## 2018-08-18 NOTE — Addendum Note (Signed)
Addended by: Brandon Melnick on: 08/18/2018 10:28 AM   Modules accepted: Orders

## 2018-08-21 ENCOUNTER — Encounter: Payer: Self-pay | Admitting: Adult Health

## 2018-08-22 ENCOUNTER — Other Ambulatory Visit: Payer: Self-pay

## 2018-08-22 ENCOUNTER — Ambulatory Visit (INDEPENDENT_AMBULATORY_CARE_PROVIDER_SITE_OTHER): Payer: PPO | Admitting: Adult Health

## 2018-08-22 ENCOUNTER — Encounter: Payer: Self-pay | Admitting: Adult Health

## 2018-08-22 DIAGNOSIS — Z9989 Dependence on other enabling machines and devices: Secondary | ICD-10-CM

## 2018-08-22 DIAGNOSIS — G4733 Obstructive sleep apnea (adult) (pediatric): Secondary | ICD-10-CM
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
## 2018-08-31 ENCOUNTER — Telehealth: Payer: Self-pay

## 2018-08-31 NOTE — Telephone Encounter (Signed)
Follow up has been scheduled. Patient is aware of appt day and time.   

## 2019-01-17 DIAGNOSIS — Z23 Encounter for immunization: Secondary | ICD-10-CM | POA: Diagnosis not present

## 2019-01-24 ENCOUNTER — Other Ambulatory Visit (HOSPITAL_COMMUNITY): Payer: Self-pay | Admitting: Family Medicine

## 2019-01-24 DIAGNOSIS — Z1231 Encounter for screening mammogram for malignant neoplasm of breast: Secondary | ICD-10-CM

## 2019-02-06 DIAGNOSIS — G4733 Obstructive sleep apnea (adult) (pediatric): Secondary | ICD-10-CM | POA: Diagnosis not present

## 2019-02-22 ENCOUNTER — Other Ambulatory Visit: Payer: Self-pay

## 2019-02-22 ENCOUNTER — Ambulatory Visit (HOSPITAL_COMMUNITY)
Admission: RE | Admit: 2019-02-22 | Discharge: 2019-02-22 | Disposition: A | Discharge status: Home / Self Care | Payer: PPO | Source: Ambulatory Visit | Attending: Family Medicine | Admitting: Family Medicine

## 2019-02-22 DIAGNOSIS — Z1231 Encounter for screening mammogram for malignant neoplasm of breast: Secondary | ICD-10-CM | POA: Insufficient documentation

## 2019-05-22 DIAGNOSIS — I10 Essential (primary) hypertension: Secondary | ICD-10-CM | POA: Diagnosis not present

## 2019-05-22 DIAGNOSIS — Z1389 Encounter for screening for other disorder: Secondary | ICD-10-CM | POA: Diagnosis not present

## 2019-05-22 DIAGNOSIS — E7849 Other hyperlipidemia: Secondary | ICD-10-CM | POA: Diagnosis not present

## 2019-05-22 DIAGNOSIS — Z6826 Body mass index (BMI) 26.0-26.9, adult: Secondary | ICD-10-CM | POA: Diagnosis not present

## 2019-05-22 DIAGNOSIS — N3010 Interstitial cystitis (chronic) without hematuria: Secondary | ICD-10-CM | POA: Diagnosis not present

## 2019-05-22 DIAGNOSIS — R945 Abnormal results of liver function studies: Secondary | ICD-10-CM | POA: Diagnosis not present

## 2019-05-22 DIAGNOSIS — Z0001 Encounter for general adult medical examination with abnormal findings: Secondary | ICD-10-CM | POA: Diagnosis not present

## 2019-05-26 DIAGNOSIS — H903 Sensorineural hearing loss, bilateral: Secondary | ICD-10-CM | POA: Diagnosis not present

## 2019-07-22 ENCOUNTER — Encounter: Payer: Self-pay | Admitting: Adult Health

## 2019-08-22 ENCOUNTER — Ambulatory Visit: Payer: PPO | Admitting: Adult Health

## 2019-08-22 ENCOUNTER — Encounter: Payer: Self-pay | Admitting: Adult Health

## 2019-08-22 ENCOUNTER — Other Ambulatory Visit: Payer: Self-pay

## 2019-08-22 VITALS — BP 130/83 | HR 64 | Temp 97.2°F | Ht 59.5 in | Wt 129.0 lb

## 2019-08-22 DIAGNOSIS — G4733 Obstructive sleep apnea (adult) (pediatric): Secondary | ICD-10-CM | POA: Diagnosis not present

## 2019-08-22 DIAGNOSIS — Z9989 Dependence on other enabling machines and devices: Secondary | ICD-10-CM
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
## 2019-08-22 NOTE — Progress Notes (Signed)
DME orders have been faxes to [employer]. Confirmation page received.

## 2019-08-22 NOTE — Patient Instructions (Signed)
Continue using CPAP nightly and greater than 4 hours each night Order sent for new supplies: new straps If your symptoms worsen or you develop new symptoms please let us know.

## 2019-08-24 DIAGNOSIS — G4733 Obstructive sleep apnea (adult) (pediatric): Secondary | ICD-10-CM | POA: Diagnosis not present

## 2019-09-28 ENCOUNTER — Telehealth: Payer: Self-pay | Admitting: Neurology

## 2019-09-28 ENCOUNTER — Telehealth: Payer: Self-pay | Admitting: *Deleted

## 2019-09-28 NOTE — Telephone Encounter (Signed)
I called the patient. I left a message, I will call back later. 

## 2019-09-28 NOTE — Telephone Encounter (Signed)
Received call from patient who asked for FU with MM, NP. I advised she was last seen in May; is she having new problems. She stated she is having head pain, not headache but pain. She asked that I look at Dr Tobey Grim schedules. I advised there is not any soon availability for NP or MD. Asked if she has called her PCP. She stated she has not because he'd probably send her here. I advised will send to nurse to discuss with [facility] and call her back Patient verbalized understanding, appreciation.

## 2019-09-28 NOTE — Telephone Encounter (Signed)
I called pt she is having new problem sharp shooting pains in L top side head.  Sporadic.  Using tylenol not helping.  No trigger that she knows of.  Started about 3 wks ago.  She has not been to her pcp.  (trying to save money).  I relayed since new problem she needs to see pcp they can refer her as new consult.  I will forward to MM and Dr. Jannifer Franklin as Juluis Rainier.

## 2019-09-29 NOTE — Telephone Encounter (Signed)
I called the patient.  Over the last several days, she has had onset of a sharp brief pain that occurs on the back of the head/top of the head on the left.  This is a random pain unassociated with head movement but the patient does have known cervical spondylosis and cervicogenic headache.  I suspect this is a variant of occipital neuralgia.  She may try Advil taking 600 mg 3 times daily for 3 weeks to see if this improves the pain, if the pain worsens, we will see her in the office and consider addition of gabapentin or carbamazepine.  She will call me if the pain worsens, currently it is only occurring 2-3 times a day and is very brief.

## 2019-09-29 NOTE — Telephone Encounter (Signed)
Please refer to telephone note from 29 September 2019.

## 2019-10-27 ENCOUNTER — Telehealth: Payer: Self-pay | Admitting: Internal Medicine

## 2019-10-27 MED ORDER — CIPROFLOXACIN HCL 500 MG PO TABS: 500.0000 mg | ORAL_TABLET | Freq: Two times a day (BID) | ORAL | 0 refills | Status: DC

## 2019-10-27 MED ORDER — METRONIDAZOLE 500 MG PO TABS: 500.0000 mg | ORAL_TABLET | Freq: Three times a day (TID) | ORAL | 0 refills | Status: DC

## 2019-10-27 NOTE — Telephone Encounter (Signed)
Shelley Petersen pt states she is having a diverticulitis flare. Reports she has pain in her LLQ that started yesterday, rates the pain at a 4 on scale of 1-10. Reports no fever and no changes in her bowels. Pt is going out of town this weekend and is requesting medication be called in for her. As DOD please advise.

## 2019-10-27 NOTE — Telephone Encounter (Signed)
Chart reviewed, she has had diverticulitis in the past. I think empiric antibiotics is reasonable. We can do cipro 500mg  BID along with flagyl 500mg  TID for 7 days, or monotherapy with Augmentin 875mg  TID for 7 days, whichever her preference. If not better or worsening despite this she needs to let us know

## 2019-10-27 NOTE — Telephone Encounter (Signed)
Pt aware, scripts sent to pharmacy.

## 2019-10-28 ENCOUNTER — Emergency Department (HOSPITAL_COMMUNITY)
Admission: EM | Admit: 2019-10-28 | Discharge: 2019-10-28 | Disposition: A | Discharge status: Home / Self Care | Payer: PPO | Attending: Emergency Medicine | Admitting: Emergency Medicine

## 2019-10-28 ENCOUNTER — Emergency Department (HOSPITAL_COMMUNITY): Payer: PPO

## 2019-10-28 ENCOUNTER — Encounter (HOSPITAL_COMMUNITY): Payer: Self-pay

## 2019-10-28 ENCOUNTER — Other Ambulatory Visit: Payer: Self-pay

## 2019-10-28 DIAGNOSIS — K5732 Diverticulitis of large intestine without perforation or abscess without bleeding: Secondary | ICD-10-CM | POA: Insufficient documentation

## 2019-10-28 DIAGNOSIS — K5792 Diverticulitis of intestine, part unspecified, without perforation or abscess without bleeding: Secondary | ICD-10-CM

## 2019-10-28 DIAGNOSIS — R1032 Left lower quadrant pain: Secondary | ICD-10-CM | POA: Diagnosis present

## 2019-10-28 DIAGNOSIS — I10 Essential (primary) hypertension: Secondary | ICD-10-CM | POA: Insufficient documentation

## 2019-10-28 LAB — COMPREHENSIVE METABOLIC PANEL
ALT: 28 U/L (ref 0–44)
AST: 21 U/L (ref 15–41)
Albumin: 4.2 g/dL (ref 3.5–5.0)
Alkaline Phosphatase: 80 U/L (ref 38–126)
Anion gap: 9 (ref 5–15)
BUN: 8 mg/dL (ref 8–23)
CO2: 23 mmol/L (ref 22–32)
Calcium: 8.7 mg/dL — ABNORMAL LOW (ref 8.9–10.3)
Chloride: 107 mmol/L (ref 98–111)
Creatinine, Ser: 0.72 mg/dL (ref 0.44–1.00)
GFR calc Af Amer: >60 mL/min (ref >60)
GFR calc non Af Amer: >60 mL/min (ref >60)
Glucose, Bld: 102 mg/dL — ABNORMAL HIGH (ref 70–99)
Potassium: 3.8 mmol/L (ref 3.5–5.1)
Sodium: 139 mmol/L (ref 135–145)
Total Bilirubin: 0.8 mg/dL (ref 0.3–1.2)
Total Protein: 7.5 g/dL (ref 6.5–8.1)

## 2019-10-28 LAB — URINALYSIS, ROUTINE W REFLEX MICROSCOPIC
Bacteria, UA: NONE SEEN
Bilirubin Urine: NEGATIVE
Glucose, UA: NEGATIVE mg/dL
Ketones, ur: NEGATIVE mg/dL
Nitrite: NEGATIVE
Protein, ur: NEGATIVE mg/dL
Specific Gravity, Urine: 1.019 (ref 1.005–1.030)
pH: 7 (ref 5.0–8.0)

## 2019-10-28 LAB — CBC WITH DIFFERENTIAL/PLATELET
Abs Immature Granulocytes: 0.01 10*3/uL (ref 0.00–0.07)
Basophils Absolute: 0 10*3/uL (ref 0.0–0.1)
Basophils Relative: 0 %
Eosinophils Absolute: 0 10*3/uL (ref 0.0–0.5)
Eosinophils Relative: 0 %
HCT: 42.3 % (ref 36.0–46.0)
Hemoglobin: 14.5 g/dL (ref 12.0–15.0)
Immature Granulocytes: 0 %
Lymphocytes Relative: 14 %
Lymphs Abs: 1.2 10*3/uL (ref 0.7–4.0)
MCH: 30.9 pg (ref 26.0–34.0)
MCHC: 34.3 g/dL (ref 30.0–36.0)
MCV: 90.2 fL (ref 80.0–100.0)
Monocytes Absolute: 1 10*3/uL (ref 0.1–1.0)
Monocytes Relative: 12 %
Neutro Abs: 5.9 10*3/uL (ref 1.7–7.7)
Neutrophils Relative %: 74 %
Platelets: 186 10*3/uL (ref 150–400)
RBC: 4.69 MIL/uL (ref 3.87–5.11)
RDW: 13.5 % (ref 11.5–15.5)
WBC: 8.1 10*3/uL (ref 4.0–10.5)
nRBC: 0 % (ref 0.0–0.2)

## 2019-10-28 LAB — LIPASE, BLOOD: Lipase: 29 U/L (ref 11–51)

## 2019-10-28 MED ORDER — SODIUM CHLORIDE 0.9 % IV BOLUS
1000.0000 mL | Freq: Once | INTRAVENOUS | Status: AC
  Administered 2019-10-28: 1000 mL via INTRAVENOUS

## 2019-10-28 MED ORDER — IOHEXOL 300 MG/ML  SOLN
100.0000 mL | Freq: Once | INTRAMUSCULAR | Status: AC | PRN
  Administered 2019-10-28: 100 mL via INTRAVENOUS

## 2019-10-28 NOTE — Discharge Instructions (Addendum)
Plenty of water to keep well-hydrated.  Continue taking the medicines prescribed.  See your GI doctor if not better in a few days.

## 2019-10-28 NOTE — ED Triage Notes (Signed)
Pt arrives from home via POV c/o generalized abdominal pain which began this Thursday. Pt reports Hx of Diverticulitis and called her PCP this week regarding this complaint. Pt was started on 2 antibiotics and has been taking them for 2 days now. Pt reports constant pain and diarrhea.
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
## 2019-11-27 DIAGNOSIS — H903 Sensorineural hearing loss, bilateral: Secondary | ICD-10-CM | POA: Diagnosis not present

## 2019-12-20 ENCOUNTER — Ambulatory Visit: Payer: PPO | Admitting: Internal Medicine

## 2020-01-17 ENCOUNTER — Ambulatory Visit: Payer: PPO | Admitting: Internal Medicine

## 2020-01-17 ENCOUNTER — Encounter: Payer: Self-pay | Admitting: Internal Medicine

## 2020-01-17 VITALS — BP 116/76 | HR 59 | Ht 59.5 in | Wt 131.0 lb

## 2020-01-17 DIAGNOSIS — K219 Gastro-esophageal reflux disease without esophagitis: Secondary | ICD-10-CM

## 2020-01-17 DIAGNOSIS — R935 Abnormal findings on diagnostic imaging of other abdominal regions, including retroperitoneum: Secondary | ICD-10-CM | POA: Diagnosis not present

## 2020-01-17 DIAGNOSIS — K5732 Diverticulitis of large intestine without perforation or abscess without bleeding: Secondary | ICD-10-CM

## 2020-01-17 MED ORDER — METRONIDAZOLE 500 MG PO TABS: 500.0000 mg | ORAL_TABLET | Freq: Two times a day (BID) | ORAL | 0 refills | Status: DC

## 2020-01-17 MED ORDER — OMEPRAZOLE 40 MG PO CPDR: 40.0000 mg | DELAYED_RELEASE_CAPSULE | Freq: Every day | ORAL | 3 refills | Status: DC

## 2020-01-17 MED ORDER — CIPROFLOXACIN HCL 500 MG PO TABS: 500.0000 mg | ORAL_TABLET | Freq: Two times a day (BID) | ORAL | 1 refills | Status: DC

## 2020-01-17 NOTE — Patient Instructions (Signed)
We have sent the following medications to your pharmacy for you to pick up at your convenience:  Omeprazole, Cipro, Flagyl  Please follow up in one year
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
## 2020-01-19 ENCOUNTER — Other Ambulatory Visit (HOSPITAL_COMMUNITY): Payer: Self-pay | Admitting: Family Medicine

## 2020-01-19 DIAGNOSIS — Z1231 Encounter for screening mammogram for malignant neoplasm of breast: Secondary | ICD-10-CM

## 2020-02-06 DIAGNOSIS — Z23 Encounter for immunization: Secondary | ICD-10-CM | POA: Diagnosis not present

## 2020-02-16 ENCOUNTER — Telehealth: Payer: Self-pay | Admitting: Adult Health

## 2020-02-16 DIAGNOSIS — G4733 Obstructive sleep apnea (adult) (pediatric): Secondary | ICD-10-CM

## 2020-02-16 DIAGNOSIS — Z9989 Dependence on other enabling machines and devices: Secondary | ICD-10-CM

## 2020-02-16 NOTE — Telephone Encounter (Signed)
Pt called stating she is needing a new prescription for her cpap supplies. Please advise.

## 2020-02-19 NOTE — Telephone Encounter (Addendum)
I called [facility] spoke to [place] in [place],  and order placed was for 6 months. Will renew pt has appt 08-2020.  (watch for refills and how long for order).

## 2020-02-19 NOTE — Telephone Encounter (Signed)
To fax signed copy to [facility] [phone].

## 2020-02-26 ENCOUNTER — Other Ambulatory Visit: Payer: Self-pay

## 2020-02-26 ENCOUNTER — Ambulatory Visit (HOSPITAL_COMMUNITY)
Admission: RE | Admit: 2020-02-26 | Discharge: 2020-02-26 | Disposition: A | Discharge status: Home / Self Care | Payer: PPO | Source: Ambulatory Visit | Attending: Family Medicine | Admitting: Family Medicine

## 2020-02-26 DIAGNOSIS — G4733 Obstructive sleep apnea (adult) (pediatric): Secondary | ICD-10-CM | POA: Diagnosis not present

## 2020-02-26 DIAGNOSIS — Z1231 Encounter for screening mammogram for malignant neoplasm of breast: Secondary | ICD-10-CM | POA: Insufficient documentation

## 2020-02-27 ENCOUNTER — Telehealth: Payer: Self-pay | Admitting: Internal Medicine

## 2020-02-27 NOTE — Telephone Encounter (Signed)
Pt states she started having a diverticulitis flare yesterday, she had cipro and flagyl so she started those last evening. Reports she is having LLQ abd pain that is sore to the touch, and a low grade fever. Her last flare was 4 months ago. Pt wanted to let Dr. Henrene Pastor know.

## 2020-02-27 NOTE — Telephone Encounter (Signed)
Pt is wanting to inform the nurse that she is experiencing another attack of diverticulitis, pt was told to inform Dr Henrene Pastor of another attack.

## 2020-02-27 NOTE — Telephone Encounter (Signed)
Thank you. Follow up with her Friday morning. Thanks

## 2020-03-01 NOTE — Telephone Encounter (Signed)
Great.  Thanks

## 2020-03-01 NOTE — Telephone Encounter (Signed)
Called to check on pt and she states the antibiotics have helped her diverticulitis. The pain in the LLQ is gone and no further temps.

## 2020-03-21 ENCOUNTER — Ambulatory Visit (HOSPITAL_COMMUNITY): Payer: PPO

## 2020-03-27 ENCOUNTER — Ambulatory Visit (HOSPITAL_COMMUNITY)
Admission: RE | Admit: 2020-03-27 | Discharge: 2020-03-27 | Disposition: A | Discharge status: Home / Self Care | Payer: PPO | Source: Ambulatory Visit | Attending: Family Medicine | Admitting: Family Medicine

## 2020-03-27 ENCOUNTER — Other Ambulatory Visit: Payer: Self-pay

## 2020-03-27 DIAGNOSIS — Z1231 Encounter for screening mammogram for malignant neoplasm of breast: Secondary | ICD-10-CM | POA: Diagnosis not present

## 2020-04-04 DIAGNOSIS — I10 Essential (primary) hypertension: Secondary | ICD-10-CM | POA: Diagnosis not present

## 2020-04-04 DIAGNOSIS — M1991 Primary osteoarthritis, unspecified site: Secondary | ICD-10-CM | POA: Diagnosis not present

## 2020-04-04 DIAGNOSIS — Z6826 Body mass index (BMI) 26.0-26.9, adult: Secondary | ICD-10-CM | POA: Diagnosis not present

## 2020-04-04 DIAGNOSIS — E663 Overweight: Secondary | ICD-10-CM | POA: Diagnosis not present

## 2020-04-16 DIAGNOSIS — S83242A Other tear of medial meniscus, current injury, left knee, initial encounter: Secondary | ICD-10-CM | POA: Diagnosis not present

## 2020-05-02 DIAGNOSIS — M25562 Pain in left knee: Secondary | ICD-10-CM | POA: Diagnosis not present

## 2020-05-21 DIAGNOSIS — M1712 Unilateral primary osteoarthritis, left knee: Secondary | ICD-10-CM | POA: Diagnosis not present

## 2020-05-28 DIAGNOSIS — M1712 Unilateral primary osteoarthritis, left knee: Secondary | ICD-10-CM | POA: Diagnosis not present

## 2020-05-31 DIAGNOSIS — I10 Essential (primary) hypertension: Secondary | ICD-10-CM | POA: Diagnosis not present

## 2020-05-31 DIAGNOSIS — E7849 Other hyperlipidemia: Secondary | ICD-10-CM | POA: Diagnosis not present

## 2020-05-31 DIAGNOSIS — Z6826 Body mass index (BMI) 26.0-26.9, adult: Secondary | ICD-10-CM | POA: Diagnosis not present

## 2020-05-31 DIAGNOSIS — Z0000 Encounter for general adult medical examination without abnormal findings: Secondary | ICD-10-CM | POA: Diagnosis not present

## 2020-05-31 DIAGNOSIS — R7309 Other abnormal glucose: Secondary | ICD-10-CM | POA: Diagnosis not present

## 2020-05-31 DIAGNOSIS — Z1331 Encounter for screening for depression: Secondary | ICD-10-CM | POA: Diagnosis not present

## 2020-05-31 DIAGNOSIS — Z1389 Encounter for screening for other disorder: Secondary | ICD-10-CM | POA: Diagnosis not present

## 2020-06-04 DIAGNOSIS — M1712 Unilateral primary osteoarthritis, left knee: Secondary | ICD-10-CM | POA: Diagnosis not present

## 2020-06-28 DIAGNOSIS — M1991 Primary osteoarthritis, unspecified site: Secondary | ICD-10-CM | POA: Diagnosis not present

## 2020-08-19 DIAGNOSIS — L821 Other seborrheic keratosis: Secondary | ICD-10-CM | POA: Diagnosis not present

## 2020-08-19 DIAGNOSIS — L570 Actinic keratosis: Secondary | ICD-10-CM | POA: Diagnosis not present

## 2020-08-19 DIAGNOSIS — X32XXXD Exposure to sunlight, subsequent encounter: Secondary | ICD-10-CM | POA: Diagnosis not present

## 2020-08-19 DIAGNOSIS — D220 Melanocytic nevi of lip: Secondary | ICD-10-CM | POA: Diagnosis not present

## 2020-08-19 DIAGNOSIS — D1801 Hemangioma of skin and subcutaneous tissue: Secondary | ICD-10-CM | POA: Diagnosis not present

## 2020-08-22 ENCOUNTER — Encounter: Payer: Self-pay | Admitting: Adult Health

## 2020-08-22 ENCOUNTER — Other Ambulatory Visit: Payer: Self-pay

## 2020-08-22 ENCOUNTER — Ambulatory Visit: Payer: PPO | Admitting: Adult Health

## 2020-08-22 VITALS — BP 118/68 | HR 65 | Ht 59.0 in | Wt 132.0 lb

## 2020-08-22 DIAGNOSIS — G4733 Obstructive sleep apnea (adult) (pediatric): Secondary | ICD-10-CM

## 2020-08-22 DIAGNOSIS — Z9989 Dependence on other enabling machines and devices: Secondary | ICD-10-CM
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
## 2020-08-22 NOTE — Patient Instructions (Signed)
Continue using CPAP nightly and greater than 4 hours each night °If your symptoms worsen or you develop new symptoms please let us know.  ° °

## 2020-09-30 ENCOUNTER — Other Ambulatory Visit: Payer: Self-pay | Admitting: Internal Medicine

## 2020-09-30 ENCOUNTER — Telehealth: Payer: Self-pay | Admitting: Internal Medicine

## 2020-09-30 NOTE — Telephone Encounter (Signed)
Ok to refill 

## 2020-09-30 NOTE — Telephone Encounter (Signed)
Pt needs rf for metronidazole 500 mg sent to [facility] in [place]. She stated to be having a diverticulitis flare up.

## 2020-10-01 MED ORDER — METRONIDAZOLE 500 MG PO TABS: 500.0000 mg | ORAL_TABLET | Freq: Two times a day (BID) | ORAL | 0 refills | Status: DC

## 2020-10-01 NOTE — Telephone Encounter (Signed)
Refilled Metronidazole 

## 2021-02-04 DIAGNOSIS — D239 Other benign neoplasm of skin, unspecified: Secondary | ICD-10-CM | POA: Diagnosis not present

## 2021-02-04 DIAGNOSIS — D485 Neoplasm of uncertain behavior of skin: Secondary | ICD-10-CM | POA: Diagnosis not present

## 2021-02-04 DIAGNOSIS — L821 Other seborrheic keratosis: Secondary | ICD-10-CM | POA: Diagnosis not present

## 2021-02-12 ENCOUNTER — Other Ambulatory Visit (HOSPITAL_COMMUNITY): Payer: Self-pay | Admitting: Family Medicine

## 2021-02-12 DIAGNOSIS — Z1231 Encounter for screening mammogram for malignant neoplasm of breast: Secondary | ICD-10-CM

## 2021-03-31 ENCOUNTER — Other Ambulatory Visit: Payer: Self-pay

## 2021-03-31 ENCOUNTER — Ambulatory Visit (HOSPITAL_COMMUNITY)
Admission: RE | Admit: 2021-03-31 | Discharge: 2021-03-31 | Disposition: A | Discharge status: Home / Self Care | Payer: PPO | Source: Ambulatory Visit | Attending: Family Medicine | Admitting: Family Medicine

## 2021-03-31 DIAGNOSIS — Z1231 Encounter for screening mammogram for malignant neoplasm of breast: Secondary | ICD-10-CM | POA: Diagnosis not present

## 2021-04-08 ENCOUNTER — Other Ambulatory Visit (HOSPITAL_COMMUNITY): Payer: Self-pay | Admitting: Family Medicine

## 2021-04-08 DIAGNOSIS — R928 Other abnormal and inconclusive findings on diagnostic imaging of breast: Secondary | ICD-10-CM

## 2021-04-10 ENCOUNTER — Other Ambulatory Visit: Payer: Self-pay | Admitting: Internal Medicine

## 2021-04-17 ENCOUNTER — Ambulatory Visit (HOSPITAL_COMMUNITY)
Admission: RE | Admit: 2021-04-17 | Discharge: 2021-04-17 | Disposition: A | Discharge status: Home / Self Care | Payer: PPO | Source: Ambulatory Visit | Attending: Family Medicine | Admitting: Family Medicine

## 2021-04-17 ENCOUNTER — Other Ambulatory Visit: Payer: Self-pay

## 2021-04-17 DIAGNOSIS — R928 Other abnormal and inconclusive findings on diagnostic imaging of breast: Secondary | ICD-10-CM

## 2021-04-17 DIAGNOSIS — R922 Inconclusive mammogram: Secondary | ICD-10-CM | POA: Diagnosis not present

## 2021-05-12 DIAGNOSIS — H04123 Dry eye syndrome of bilateral lacrimal glands: Secondary | ICD-10-CM | POA: Diagnosis not present

## 2021-07-05 ENCOUNTER — Other Ambulatory Visit: Payer: Self-pay | Admitting: Internal Medicine

## 2021-07-09 ENCOUNTER — Other Ambulatory Visit: Payer: Self-pay | Admitting: Internal Medicine

## 2021-07-14 ENCOUNTER — Telehealth: Payer: Self-pay | Admitting: Internal Medicine

## 2021-07-14 MED ORDER — OMEPRAZOLE 40 MG PO CPDR: 40.0000 mg | DELAYED_RELEASE_CAPSULE | Freq: Every day | ORAL | 0 refills | Status: DC

## 2021-07-14 NOTE — Telephone Encounter (Signed)
Refilled Omeprazole 

## 2021-07-14 NOTE — Telephone Encounter (Signed)
Inbound call from patient requesting medication refill for Omeprazole '40mg'$  sent to [facility]. Patient have OV scheduled 5/2 ?

## 2021-07-23 DIAGNOSIS — E7849 Other hyperlipidemia: Secondary | ICD-10-CM | POA: Diagnosis not present

## 2021-07-23 DIAGNOSIS — M1991 Primary osteoarthritis, unspecified site: Secondary | ICD-10-CM | POA: Diagnosis not present

## 2021-07-23 DIAGNOSIS — E559 Vitamin D deficiency, unspecified: Secondary | ICD-10-CM | POA: Diagnosis not present

## 2021-07-23 DIAGNOSIS — E782 Mixed hyperlipidemia: Secondary | ICD-10-CM | POA: Diagnosis not present

## 2021-07-23 DIAGNOSIS — R945 Abnormal results of liver function studies: Secondary | ICD-10-CM | POA: Diagnosis not present

## 2021-07-23 DIAGNOSIS — Z6826 Body mass index (BMI) 26.0-26.9, adult: Secondary | ICD-10-CM | POA: Diagnosis not present

## 2021-07-23 DIAGNOSIS — E663 Overweight: Secondary | ICD-10-CM | POA: Diagnosis not present

## 2021-07-23 DIAGNOSIS — R7309 Other abnormal glucose: Secondary | ICD-10-CM | POA: Diagnosis not present

## 2021-07-23 DIAGNOSIS — I10 Essential (primary) hypertension: Secondary | ICD-10-CM | POA: Diagnosis not present

## 2021-08-12 ENCOUNTER — Encounter: Payer: Self-pay | Admitting: Internal Medicine

## 2021-08-12 ENCOUNTER — Ambulatory Visit: Payer: PPO | Admitting: Internal Medicine

## 2021-08-12 VITALS — BP 110/64 | HR 103 | Ht 59.5 in | Wt 123.0 lb

## 2021-08-12 DIAGNOSIS — K222 Esophageal obstruction: Secondary | ICD-10-CM | POA: Diagnosis not present

## 2021-08-12 DIAGNOSIS — K5732 Diverticulitis of large intestine without perforation or abscess without bleeding: Secondary | ICD-10-CM | POA: Diagnosis not present

## 2021-08-12 DIAGNOSIS — K219 Gastro-esophageal reflux disease without esophagitis: Secondary | ICD-10-CM

## 2021-08-12 DIAGNOSIS — Z8719 Personal history of other diseases of the digestive system: Secondary | ICD-10-CM

## 2021-08-12 MED ORDER — OMEPRAZOLE 40 MG PO CPDR: 40.0000 mg | DELAYED_RELEASE_CAPSULE | Freq: Two times a day (BID) | ORAL | 3 refills | Status: AC

## 2021-08-12 MED ORDER — AMOXICILLIN-POT CLAVULANATE 875-125 MG PO TABS: 1.0000 | ORAL_TABLET | Freq: Two times a day (BID) | ORAL | 0 refills | Status: AC
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
## 2021-08-12 NOTE — Patient Instructions (Addendum)
If you are age 71 or older, your body mass index should be between 23-30. Your Body mass index is 24.43 kg/m?Marland Kitchen If this is out of the aforementioned range listed, please consider follow up with your Primary Care Provider. ? ?If you are age 65 or younger, your body mass index should be between 19-25. Your Body mass index is 24.43 kg/m?Marland Kitchen If this is out of the aformentioned range listed, please consider follow up with your Primary Care Provider.  ? ?________________________________________________________ ? ?The [facility] providers would like to encourage you to use [facility] to communicate with providers for non-urgent requests or questions.  Due to long hold times on the telephone, sending your provider a message by [facility] may be a faster and more efficient way to get a response.  Please allow 48 business hours for a response.  Please remember that this is for non-urgent requests.  ?_______________________________________________________ ? ?We have sent the following medications to your pharmacy for you to pick up at your convenience:  Omeprazole - twice a day; Augmentin ? ?Please follow up in one year ? ?

## 2021-08-18 ENCOUNTER — Telehealth: Payer: Self-pay | Admitting: Internal Medicine

## 2021-08-18 NOTE — Telephone Encounter (Signed)
Patient called states she is doing better with the antibiotics however she has some issues this weekend please call to advise further. ?

## 2021-08-18 NOTE — Telephone Encounter (Signed)
Pt states Dr. Henrene Pastor wanted her to call if she had another Flare of diverticulitis. States he wrote her prescriptions for antibiotics on Thursday and over the weekend she had a flare. Reports she had pain in the LLQ going all across her abdomen along with a low grade fever. Pt started the antibiotics and reports she is feeling better. Dr. Henrene Pastor notified. ?

## 2021-08-19 NOTE — Telephone Encounter (Signed)
Returned call to patient and informed her of Dr. Blanch Media recommendations. Pt verbalized understanding and had no concerns at the end of the call. ?

## 2021-08-19 NOTE — Telephone Encounter (Signed)
Have her complete the course of antibiotics. ?Please contact the office if the symptoms recur or worsen.  As always, for severe pain go to the ER. ?Thank her for calling with an update. ?

## 2021-08-25 ENCOUNTER — Encounter: Payer: Self-pay | Admitting: *Deleted

## 2021-08-26 ENCOUNTER — Telehealth: Payer: PPO | Admitting: Adult Health

## 2021-12-23 DIAGNOSIS — Z0001 Encounter for general adult medical examination with abnormal findings: Secondary | ICD-10-CM | POA: Diagnosis not present

## 2021-12-23 DIAGNOSIS — E559 Vitamin D deficiency, unspecified: Secondary | ICD-10-CM | POA: Diagnosis not present

## 2021-12-23 DIAGNOSIS — R7309 Other abnormal glucose: Secondary | ICD-10-CM | POA: Diagnosis not present

## 2021-12-23 DIAGNOSIS — I10 Essential (primary) hypertension: Secondary | ICD-10-CM | POA: Diagnosis not present

## 2021-12-23 DIAGNOSIS — E7849 Other hyperlipidemia: Secondary | ICD-10-CM | POA: Diagnosis not present

## 2021-12-23 DIAGNOSIS — R945 Abnormal results of liver function studies: Secondary | ICD-10-CM | POA: Diagnosis not present

## 2021-12-23 DIAGNOSIS — Z6825 Body mass index (BMI) 25.0-25.9, adult: Secondary | ICD-10-CM | POA: Diagnosis not present

## 2021-12-23 DIAGNOSIS — E782 Mixed hyperlipidemia: Secondary | ICD-10-CM | POA: Diagnosis not present

## 2021-12-23 DIAGNOSIS — Z23 Encounter for immunization: Secondary | ICD-10-CM | POA: Diagnosis not present

## 2021-12-23 DIAGNOSIS — M1991 Primary osteoarthritis, unspecified site: Secondary | ICD-10-CM | POA: Diagnosis not present

## 2021-12-23 DIAGNOSIS — Z1331 Encounter for screening for depression: Secondary | ICD-10-CM | POA: Diagnosis not present

## 2022-03-10 ENCOUNTER — Other Ambulatory Visit: Payer: Self-pay | Admitting: Family Medicine

## 2022-03-10 DIAGNOSIS — Z1231 Encounter for screening mammogram for malignant neoplasm of breast: Secondary | ICD-10-CM

## 2022-04-02 DIAGNOSIS — H903 Sensorineural hearing loss, bilateral: Secondary | ICD-10-CM | POA: Diagnosis not present

## 2022-05-01 ENCOUNTER — Other Ambulatory Visit (HOSPITAL_COMMUNITY): Payer: Self-pay | Admitting: Family Medicine

## 2022-05-01 DIAGNOSIS — Z1231 Encounter for screening mammogram for malignant neoplasm of breast: Secondary | ICD-10-CM

## 2022-05-06 ENCOUNTER — Ambulatory Visit (HOSPITAL_COMMUNITY)
Admission: RE | Admit: 2022-05-06 | Discharge: 2022-05-06 | Disposition: A | Discharge status: Home / Self Care | Payer: PPO | Source: Ambulatory Visit | Attending: Family Medicine | Admitting: Family Medicine

## 2022-05-06 ENCOUNTER — Ambulatory Visit: Payer: PPO

## 2022-05-06 DIAGNOSIS — Z1231 Encounter for screening mammogram for malignant neoplasm of breast: Secondary | ICD-10-CM | POA: Insufficient documentation

## 2022-05-11 DIAGNOSIS — X32XXXD Exposure to sunlight, subsequent encounter: Secondary | ICD-10-CM | POA: Diagnosis not present

## 2022-05-11 DIAGNOSIS — L570 Actinic keratosis: Secondary | ICD-10-CM | POA: Diagnosis not present

## 2022-05-11 DIAGNOSIS — L821 Other seborrheic keratosis: Secondary | ICD-10-CM | POA: Diagnosis not present

## 2022-05-11 DIAGNOSIS — I781 Nevus, non-neoplastic: Secondary | ICD-10-CM | POA: Diagnosis not present

## 2022-05-13 DIAGNOSIS — H04123 Dry eye syndrome of bilateral lacrimal glands: Secondary | ICD-10-CM | POA: Diagnosis not present

## 2022-07-18 DIAGNOSIS — L237 Allergic contact dermatitis due to plants, except food: Secondary | ICD-10-CM | POA: Diagnosis not present

## 2022-07-18 DIAGNOSIS — R030 Elevated blood-pressure reading, without diagnosis of hypertension: Secondary | ICD-10-CM | POA: Diagnosis not present

## 2022-07-24 DIAGNOSIS — L237 Allergic contact dermatitis due to plants, except food: Secondary | ICD-10-CM | POA: Diagnosis not present

## 2022-07-24 DIAGNOSIS — Z6826 Body mass index (BMI) 26.0-26.9, adult: Secondary | ICD-10-CM | POA: Diagnosis not present

## 2022-07-24 DIAGNOSIS — E663 Overweight: Secondary | ICD-10-CM | POA: Diagnosis not present

## 2022-08-10 DIAGNOSIS — L237 Allergic contact dermatitis due to plants, except food: Secondary | ICD-10-CM | POA: Diagnosis not present

## 2022-08-10 DIAGNOSIS — E663 Overweight: Secondary | ICD-10-CM | POA: Diagnosis not present

## 2022-08-10 DIAGNOSIS — Z6826 Body mass index (BMI) 26.0-26.9, adult: Secondary | ICD-10-CM | POA: Diagnosis not present

## 2022-08-27 DIAGNOSIS — L304 Erythema intertrigo: Secondary | ICD-10-CM | POA: Diagnosis not present

## 2022-08-27 DIAGNOSIS — L820 Inflamed seborrheic keratosis: Secondary | ICD-10-CM | POA: Diagnosis not present

## 2022-10-07 DIAGNOSIS — H903 Sensorineural hearing loss, bilateral: Secondary | ICD-10-CM | POA: Diagnosis not present

## 2023-01-07 DIAGNOSIS — E782 Mixed hyperlipidemia: Secondary | ICD-10-CM | POA: Diagnosis not present

## 2023-01-07 DIAGNOSIS — E663 Overweight: Secondary | ICD-10-CM | POA: Diagnosis not present

## 2023-01-07 DIAGNOSIS — Z6825 Body mass index (BMI) 25.0-25.9, adult: Secondary | ICD-10-CM | POA: Diagnosis not present

## 2023-01-07 DIAGNOSIS — Z0001 Encounter for general adult medical examination with abnormal findings: Secondary | ICD-10-CM | POA: Diagnosis not present

## 2023-01-07 DIAGNOSIS — Z1331 Encounter for screening for depression: Secondary | ICD-10-CM | POA: Diagnosis not present

## 2023-01-07 DIAGNOSIS — E7849 Other hyperlipidemia: Secondary | ICD-10-CM | POA: Diagnosis not present

## 2023-01-07 DIAGNOSIS — R945 Abnormal results of liver function studies: Secondary | ICD-10-CM | POA: Diagnosis not present

## 2023-01-07 DIAGNOSIS — I10 Essential (primary) hypertension: Secondary | ICD-10-CM | POA: Diagnosis not present

## 2023-01-07 DIAGNOSIS — E559 Vitamin D deficiency, unspecified: Secondary | ICD-10-CM | POA: Diagnosis not present

## 2023-03-23 ENCOUNTER — Other Ambulatory Visit (HOSPITAL_COMMUNITY): Payer: Self-pay | Admitting: Family Medicine

## 2023-03-23 DIAGNOSIS — Z1231 Encounter for screening mammogram for malignant neoplasm of breast: Secondary | ICD-10-CM

## 2023-03-31 DIAGNOSIS — X32XXXD Exposure to sunlight, subsequent encounter: Secondary | ICD-10-CM | POA: Diagnosis not present

## 2023-03-31 DIAGNOSIS — L570 Actinic keratosis: Secondary | ICD-10-CM | POA: Diagnosis not present

## 2023-03-31 DIAGNOSIS — C44729 Squamous cell carcinoma of skin of left lower limb, including hip: Secondary | ICD-10-CM | POA: Diagnosis not present

## 2023-05-10 ENCOUNTER — Ambulatory Visit (HOSPITAL_COMMUNITY)
Admission: RE | Admit: 2023-05-10 | Discharge: 2023-05-10 | Disposition: A | Discharge status: Home / Self Care | Payer: PPO | Source: Ambulatory Visit | Attending: Family Medicine | Admitting: Family Medicine

## 2023-05-10 ENCOUNTER — Encounter (HOSPITAL_COMMUNITY): Payer: Self-pay

## 2023-05-10 DIAGNOSIS — Z1231 Encounter for screening mammogram for malignant neoplasm of breast: Secondary | ICD-10-CM | POA: Insufficient documentation

## 2023-05-13 DIAGNOSIS — X32XXXD Exposure to sunlight, subsequent encounter: Secondary | ICD-10-CM | POA: Diagnosis not present

## 2023-05-13 DIAGNOSIS — Z08 Encounter for follow-up examination after completed treatment for malignant neoplasm: Secondary | ICD-10-CM | POA: Diagnosis not present

## 2023-05-13 DIAGNOSIS — L570 Actinic keratosis: Secondary | ICD-10-CM | POA: Diagnosis not present

## 2023-05-13 DIAGNOSIS — Z85828 Personal history of other malignant neoplasm of skin: Secondary | ICD-10-CM | POA: Diagnosis not present

## 2023-05-31 DIAGNOSIS — H43393 Other vitreous opacities, bilateral: Secondary | ICD-10-CM | POA: Diagnosis not present

## 2023-09-02 DIAGNOSIS — Z85828 Personal history of other malignant neoplasm of skin: Secondary | ICD-10-CM | POA: Diagnosis not present

## 2023-09-02 DIAGNOSIS — B078 Other viral warts: Secondary | ICD-10-CM | POA: Diagnosis not present

## 2023-09-02 DIAGNOSIS — Z08 Encounter for follow-up examination after completed treatment for malignant neoplasm: Secondary | ICD-10-CM | POA: Diagnosis not present

## 2023-09-02 DIAGNOSIS — S70261A Insect bite (nonvenomous), right hip, initial encounter: Secondary | ICD-10-CM | POA: Diagnosis not present

## 2023-12-23 DIAGNOSIS — X32XXXD Exposure to sunlight, subsequent encounter: Secondary | ICD-10-CM | POA: Diagnosis not present

## 2023-12-23 DIAGNOSIS — C44722 Squamous cell carcinoma of skin of right lower limb, including hip: Secondary | ICD-10-CM | POA: Diagnosis not present

## 2023-12-23 DIAGNOSIS — L570 Actinic keratosis: Secondary | ICD-10-CM | POA: Diagnosis not present
